# Patient Record
Sex: Male | Born: 1960 | Race: Black or African American | Hispanic: No | Marital: Married | Smoking: Never smoker
Health system: Southern US, Community
[De-identification: ages and names within clinical notes are randomized; demographics above are authoritative.]

## PROBLEM LIST (undated history)

## (undated) DIAGNOSIS — R590 Localized enlarged lymph nodes: Secondary | ICD-10-CM

## (undated) DIAGNOSIS — I10 Essential (primary) hypertension: Secondary | ICD-10-CM

## (undated) DIAGNOSIS — I517 Cardiomegaly: Secondary | ICD-10-CM

## (undated) DIAGNOSIS — Z9114 Patient's other noncompliance with medication regimen: Secondary | ICD-10-CM

## (undated) DIAGNOSIS — I5022 Chronic systolic (congestive) heart failure: Secondary | ICD-10-CM

## (undated) DIAGNOSIS — G4730 Sleep apnea, unspecified: Secondary | ICD-10-CM

## (undated) DIAGNOSIS — N183 Chronic kidney disease, stage 3 unspecified: Secondary | ICD-10-CM

## (undated) DIAGNOSIS — I428 Other cardiomyopathies: Secondary | ICD-10-CM

## (undated) DIAGNOSIS — E785 Hyperlipidemia, unspecified: Secondary | ICD-10-CM

## (undated) DIAGNOSIS — Z91148 Patient's other noncompliance with medication regimen for other reason: Secondary | ICD-10-CM

## (undated) DIAGNOSIS — I119 Hypertensive heart disease without heart failure: Secondary | ICD-10-CM

## (undated) DIAGNOSIS — I77810 Thoracic aortic ectasia: Secondary | ICD-10-CM

## (undated) HISTORY — DX: Patient's other noncompliance with medication regimen for other reason: Z91.148

## (undated) HISTORY — DX: Hyperlipidemia, unspecified: E78.5

## (undated) HISTORY — PX: VASECTOMY: SHX75

## (undated) HISTORY — DX: Chronic systolic (congestive) heart failure: I50.22

## (undated) HISTORY — DX: Chronic kidney disease, stage 3 (moderate): N18.3

## (undated) HISTORY — DX: Localized enlarged lymph nodes: R59.0

## (undated) HISTORY — PX: TUMOR EXCISION: SHX421

## (undated) HISTORY — DX: Cardiomegaly: I51.7

## (undated) HISTORY — DX: Other cardiomyopathies: I42.8

## (undated) HISTORY — DX: Patient's other noncompliance with medication regimen: Z91.14

## (undated) HISTORY — PX: FACIAL FRACTURE SURGERY: SHX1570

## (undated) HISTORY — DX: Chronic kidney disease, stage 3 unspecified: N18.30

## (undated) HISTORY — DX: Hypertensive heart disease without heart failure: I11.9

---

## 2006-05-02 ENCOUNTER — Ambulatory Visit: Payer: Self-pay | Admitting: Internal Medicine

## 2007-07-25 ENCOUNTER — Encounter: Admission: RE | Admit: 2007-07-25 | Discharge: 2007-07-25 | Payer: Self-pay | Admitting: Otolaryngology

## 2007-09-30 ENCOUNTER — Ambulatory Visit: Payer: Self-pay | Admitting: Family Medicine

## 2008-10-07 ENCOUNTER — Ambulatory Visit: Payer: Self-pay | Admitting: Family Medicine

## 2008-11-19 ENCOUNTER — Ambulatory Visit: Payer: Self-pay | Admitting: Gastroenterology

## 2009-02-02 ENCOUNTER — Ambulatory Visit: Payer: Self-pay | Admitting: Otolaryngology

## 2009-02-02 ENCOUNTER — Ambulatory Visit: Payer: Self-pay | Admitting: Cardiology

## 2009-02-08 ENCOUNTER — Ambulatory Visit: Payer: Self-pay | Admitting: Otolaryngology

## 2009-04-19 ENCOUNTER — Ambulatory Visit: Payer: Self-pay | Admitting: Internal Medicine

## 2009-11-11 ENCOUNTER — Ambulatory Visit: Payer: Self-pay | Admitting: Nephrology

## 2010-04-24 ENCOUNTER — Encounter: Payer: Self-pay | Admitting: Otolaryngology

## 2012-08-08 ENCOUNTER — Ambulatory Visit: Payer: Self-pay | Admitting: Ophthalmology

## 2014-04-17 ENCOUNTER — Encounter (HOSPITAL_COMMUNITY): Payer: Self-pay | Admitting: Emergency Medicine

## 2014-04-17 ENCOUNTER — Emergency Department (HOSPITAL_COMMUNITY): Payer: No Typology Code available for payment source

## 2014-04-17 ENCOUNTER — Inpatient Hospital Stay (HOSPITAL_COMMUNITY)
Admission: EM | Admit: 2014-04-17 | Discharge: 2014-04-21 | DRG: 286 | Disposition: A | Discharge status: Home / Self Care | Payer: No Typology Code available for payment source | Attending: Internal Medicine | Admitting: Internal Medicine

## 2014-04-17 ENCOUNTER — Inpatient Hospital Stay (HOSPITAL_COMMUNITY): Payer: No Typology Code available for payment source

## 2014-04-17 DIAGNOSIS — N179 Acute kidney failure, unspecified: Secondary | ICD-10-CM | POA: Diagnosis present

## 2014-04-17 DIAGNOSIS — R634 Abnormal weight loss: Secondary | ICD-10-CM | POA: Diagnosis present

## 2014-04-17 DIAGNOSIS — I10 Essential (primary) hypertension: Secondary | ICD-10-CM | POA: Diagnosis present

## 2014-04-17 DIAGNOSIS — G4733 Obstructive sleep apnea (adult) (pediatric): Secondary | ICD-10-CM | POA: Diagnosis present

## 2014-04-17 DIAGNOSIS — I130 Hypertensive heart and chronic kidney disease with heart failure and stage 1 through stage 4 chronic kidney disease, or unspecified chronic kidney disease: Principal | ICD-10-CM | POA: Diagnosis present

## 2014-04-17 DIAGNOSIS — Z823 Family history of stroke: Secondary | ICD-10-CM

## 2014-04-17 DIAGNOSIS — Z9119 Patient's noncompliance with other medical treatment and regimen: Secondary | ICD-10-CM | POA: Diagnosis present

## 2014-04-17 DIAGNOSIS — E785 Hyperlipidemia, unspecified: Secondary | ICD-10-CM | POA: Diagnosis present

## 2014-04-17 DIAGNOSIS — J019 Acute sinusitis, unspecified: Secondary | ICD-10-CM | POA: Diagnosis present

## 2014-04-17 DIAGNOSIS — R05 Cough: Secondary | ICD-10-CM | POA: Diagnosis present

## 2014-04-17 DIAGNOSIS — I429 Cardiomyopathy, unspecified: Secondary | ICD-10-CM | POA: Diagnosis present

## 2014-04-17 DIAGNOSIS — Z79899 Other long term (current) drug therapy: Secondary | ICD-10-CM

## 2014-04-17 DIAGNOSIS — Z800 Family history of malignant neoplasm of digestive organs: Secondary | ICD-10-CM | POA: Diagnosis not present

## 2014-04-17 DIAGNOSIS — D869 Sarcoidosis, unspecified: Secondary | ICD-10-CM | POA: Diagnosis present

## 2014-04-17 DIAGNOSIS — Z7982 Long term (current) use of aspirin: Secondary | ICD-10-CM | POA: Diagnosis not present

## 2014-04-17 DIAGNOSIS — Z7951 Long term (current) use of inhaled steroids: Secondary | ICD-10-CM

## 2014-04-17 DIAGNOSIS — R0989 Other specified symptoms and signs involving the circulatory and respiratory systems: Secondary | ICD-10-CM

## 2014-04-17 DIAGNOSIS — I5021 Acute systolic (congestive) heart failure: Secondary | ICD-10-CM

## 2014-04-17 DIAGNOSIS — R0600 Dyspnea, unspecified: Secondary | ICD-10-CM | POA: Diagnosis present

## 2014-04-17 DIAGNOSIS — R938 Abnormal findings on diagnostic imaging of other specified body structures: Secondary | ICD-10-CM

## 2014-04-17 DIAGNOSIS — J018 Other acute sinusitis: Secondary | ICD-10-CM

## 2014-04-17 DIAGNOSIS — R9389 Abnormal findings on diagnostic imaging of other specified body structures: Secondary | ICD-10-CM

## 2014-04-17 DIAGNOSIS — E876 Hypokalemia: Secondary | ICD-10-CM | POA: Diagnosis not present

## 2014-04-17 DIAGNOSIS — N183 Chronic kidney disease, stage 3 (moderate): Secondary | ICD-10-CM | POA: Diagnosis present

## 2014-04-17 DIAGNOSIS — R059 Cough, unspecified: Secondary | ICD-10-CM

## 2014-04-17 HISTORY — DX: Sleep apnea, unspecified: G47.30

## 2014-04-17 HISTORY — DX: Essential (primary) hypertension: I10

## 2014-04-17 LAB — HEPATIC FUNCTION PANEL
ALBUMIN: 3.6 g/dL (ref 3.5–5.2)
ALT: 16 U/L (ref 0–53)
AST: 21 U/L (ref 0–37)
Alkaline Phosphatase: 64 U/L (ref 39–117)
Bilirubin, Direct: 0.1 mg/dL (ref 0.0–0.3)
Indirect Bilirubin: 0.5 mg/dL (ref 0.3–0.9)
Total Bilirubin: 0.6 mg/dL (ref 0.3–1.2)
Total Protein: 6.1 g/dL (ref 6.0–8.3)

## 2014-04-17 LAB — BRAIN NATRIURETIC PEPTIDE: B Natriuretic Peptide: 1004 pg/mL — ABNORMAL HIGH (ref 0.0–100.0)

## 2014-04-17 LAB — CBC
HCT: 39.3 % (ref 39.0–52.0)
HEMOGLOBIN: 13.5 g/dL (ref 13.0–17.0)
MCH: 29.7 pg (ref 26.0–34.0)
MCHC: 34.4 g/dL (ref 30.0–36.0)
MCV: 86.4 fL (ref 78.0–100.0)
PLATELETS: 203 10*3/uL (ref 150–400)
RBC: 4.55 MIL/uL (ref 4.22–5.81)
RDW: 12.7 % (ref 11.5–15.5)
WBC: 4.9 10*3/uL (ref 4.0–10.5)

## 2014-04-17 LAB — BASIC METABOLIC PANEL
Anion gap: 11 (ref 5–15)
BUN: 16 mg/dL (ref 6–23)
CALCIUM: 9.4 mg/dL (ref 8.4–10.5)
CO2: 28 mmol/L (ref 19–32)
Chloride: 103 mEq/L (ref 96–112)
Creatinine, Ser: 1.27 mg/dL (ref 0.50–1.35)
GFR, EST AFRICAN AMERICAN: 74 mL/min — AB (ref >90)
GFR, EST NON AFRICAN AMERICAN: 64 mL/min — AB (ref >90)
GLUCOSE: 107 mg/dL — AB (ref 70–99)
Potassium: 3.6 mmol/L (ref 3.5–5.1)
SODIUM: 142 mmol/L (ref 135–145)

## 2014-04-17 LAB — TSH: TSH: 1.866 u[IU]/mL (ref 0.350–4.500)

## 2014-04-17 MED ORDER — ZOLPIDEM TARTRATE 5 MG PO TABS: 5.0000 mg | ORAL_TABLET | Freq: Every evening | ORAL | Status: DC | PRN

## 2014-04-17 MED ORDER — DIPHENHYDRAMINE HCL 25 MG PO TABS
25.0000 mg | ORAL_TABLET | Freq: Four times a day (QID) | ORAL | Status: DC | PRN
  Filled 2014-04-17: qty 1

## 2014-04-17 MED ORDER — ACETAMINOPHEN 650 MG RE SUPP: 650.0000 mg | Freq: Four times a day (QID) | RECTAL | Status: DC | PRN

## 2014-04-17 MED ORDER — FLUTICASONE PROPIONATE 50 MCG/ACT NA SUSP
1.0000 | Freq: Every day | NASAL | Status: DC
  Administered 2014-04-17 – 2014-04-21 (×5): 1 via NASAL
  Filled 2014-04-17: qty 16

## 2014-04-17 MED ORDER — LORATADINE 10 MG PO TABS
10.0000 mg | ORAL_TABLET | Freq: Every day | ORAL | Status: DC
  Administered 2014-04-17 – 2014-04-21 (×5): 10 mg via ORAL
  Filled 2014-04-17 (×5): qty 1

## 2014-04-17 MED ORDER — ENOXAPARIN SODIUM 40 MG/0.4ML ~~LOC~~ SOLN
40.0000 mg | SUBCUTANEOUS | Status: DC
  Administered 2014-04-17 – 2014-04-20 (×4): 40 mg via SUBCUTANEOUS
  Filled 2014-04-17 (×5): qty 0.4

## 2014-04-17 MED ORDER — FUROSEMIDE 10 MG/ML IJ SOLN
40.0000 mg | Freq: Every day | INTRAMUSCULAR | Status: DC
  Administered 2014-04-17: 40 mg via INTRAVENOUS
  Filled 2014-04-17 (×2): qty 4

## 2014-04-17 MED ORDER — ASPIRIN EC 81 MG PO TBEC
81.0000 mg | DELAYED_RELEASE_TABLET | Freq: Every day | ORAL | Status: DC
  Administered 2014-04-17 – 2014-04-19 (×3): 81 mg via ORAL
  Filled 2014-04-17 (×3): qty 1

## 2014-04-17 MED ORDER — POTASSIUM CHLORIDE 20 MEQ/15ML (10%) PO SOLN
40.0000 meq | Freq: Every day | ORAL | Status: DC
  Administered 2014-04-17 – 2014-04-19 (×3): 40 meq via ORAL
  Filled 2014-04-17 (×4): qty 30

## 2014-04-17 MED ORDER — IOHEXOL 300 MG/ML  SOLN
80.0000 mL | Freq: Once | INTRAMUSCULAR | Status: AC | PRN
  Administered 2014-04-17: 80 mL via INTRAVENOUS

## 2014-04-17 MED ORDER — GUAIFENESIN 100 MG/5ML PO SYRP
200.0000 mg | ORAL_SOLUTION | Freq: Three times a day (TID) | ORAL | Status: DC | PRN
  Administered 2014-04-17: 200 mg via ORAL
  Filled 2014-04-17: qty 10

## 2014-04-17 MED ORDER — SODIUM CHLORIDE 0.9 % IV BOLUS (SEPSIS): 1000.0000 mL | Freq: Once | INTRAVENOUS | Status: DC

## 2014-04-17 MED ORDER — ALBUTEROL SULFATE (2.5 MG/3ML) 0.083% IN NEBU: 2.5000 mg | INHALATION_SOLUTION | RESPIRATORY_TRACT | Status: DC | PRN

## 2014-04-17 MED ORDER — LOSARTAN POTASSIUM 50 MG PO TABS
50.0000 mg | ORAL_TABLET | Freq: Every day | ORAL | Status: DC
  Administered 2014-04-17: 50 mg via ORAL
  Filled 2014-04-17 (×2): qty 1

## 2014-04-17 MED ORDER — AMOXICILLIN-POT CLAVULANATE 875-125 MG PO TABS
1.0000 | ORAL_TABLET | Freq: Two times a day (BID) | ORAL | Status: DC
  Administered 2014-04-17 – 2014-04-21 (×8): 1 via ORAL
  Filled 2014-04-17 (×9): qty 1

## 2014-04-17 MED ORDER — CARVEDILOL 12.5 MG PO TABS
12.5000 mg | ORAL_TABLET | Freq: Two times a day (BID) | ORAL | Status: DC
  Administered 2014-04-17 – 2014-04-19 (×4): 12.5 mg via ORAL
  Filled 2014-04-17 (×6): qty 1

## 2014-04-17 MED ORDER — ACETAMINOPHEN 325 MG PO TABS
650.0000 mg | ORAL_TABLET | Freq: Four times a day (QID) | ORAL | Status: DC | PRN
  Filled 2014-04-17: qty 2

## 2014-04-17 NOTE — ED Notes (Signed)
[facility] further eval of cough and hypertension. Pt seen there today and was coughing a lot during exam. Pt BP was 180/138 in office.

## 2014-04-17 NOTE — ED Notes (Signed)
Dr. Rhunette CroftNanavati back at the bedside. Made pt aware he would be admitted.

## 2014-04-17 NOTE — ED Notes (Signed)
Dr. Nanavati at the bedside.  

## 2014-04-17 NOTE — H&P (Signed)
[facility] History and Physical  Sergei Delo ZOX:[id number] DOB: 03/16/1961 DOA: 04/17/2014  Referring physician: EDP PCP: [name], MD   Chief Complaint: cough, dyspnea  HPI: Jake Levine is a 54 y.o. male  With h/o HTN, untreated for years, "enlarged heart", obstructive sleep apnea, previously on CPAP but reportedly improved with 40 pound weight loss, presents to the emergency room with several week history of nonproductive cough, dyspnea on exertion, orthopnea, postnasal drip. He reports being off antihypertensives for many years "because they weren't controlling blood pressure and causing too many side effects ". He denies fevers chills sinus pressure headache. He has been sleeping upright due to worsening cough and dyspnea at night. He denies edema. Denies previous history of heart failure. Has been taking Benadryl Claritin decongestants Flonase and even used his stepson's albuterol inhaler. He denies history of asthma or COPD or previous smoking but reports that the albuterol helped somewhat. He reports having had PFTs in the past and that he was told he "might have asthma". In the emergency room, BNP above 1000.  Blood pressure 172/115.  Chest x-ray showed enlarged cardiac silhouette and pulmonary vascular congestion as well as right hilar fullness.  Therefore CAT scan was ordered and shows hilar and subcarinal lymphadenopathy with subcentimeter nodules, possibly consistent with sarcoidosis. Patient has no history of same.  EKG not done in ER.  Review of Systems:  As per history of present illness. Complete systems reviewed otherwise negative.  Past Medical History  Diagnosis Date  . Hypertension   . Enlarged heart   . Renal disorder   . Renal insufficiency    Past Surgical History  Procedure Laterality Date  . Neck surgery     Social History:  reports that he has never smoked. He does not have any smokeless tobacco history on file. He reports that  he drinks about 4.2 oz of alcohol per week. He reports that he does not use illicit drugs. works in maintenance  Allergies  Allergen Reactions  . Other     Blood pressure medications    Family History  Problem Relation Age of Onset  . Colon cancer Father   . Lupus Sister   . Stroke Mother      Prior to Admission medications   Medication Sig Start Date End Date Taking? Authorizing Provider  albuterol (PROVENTIL HFA;VENTOLIN HFA) 108 (90 BASE) MCG/ACT inhaler Inhale 1-2 puffs into the lungs every 6 (six) hours as needed for wheezing or shortness of breath.   Yes Historical Provider, MD  aspirin EC 81 MG tablet Take 81 mg by mouth daily.   Yes Historical Provider, MD  diphenhydrAMINE (BENADRYL) 25 MG tablet Take 25 mg by mouth every 6 (six) hours as needed for allergies.   Yes Historical Provider, MD  fluticasone (FLONASE) 50 MCG/ACT nasal spray Place 1 spray into both nostrils daily.   Yes Historical Provider, MD  guaifenesin (ROBITUSSIN) 100 MG/5ML syrup Take 200 mg by mouth 3 (three) times daily as needed for cough.   Yes Historical Provider, MD  loratadine (CLARITIN) 10 MG tablet Take 10 mg by mouth daily.   Yes Historical Provider, MD  Phenylephrine-Pheniramine-DM [facility] COLD & COUGH PO) Take 1 Dose by mouth once.   Yes Historical Provider, MD   Physical Exam: Filed Vitals:   04/17/14 1455 04/17/14 1530 04/17/14 1628 04/17/14 1700  BP: 172/115 169/110 157/112 159/84  Pulse: 74 83 89 76  Temp:      TempSrc:      Resp: 18  21 29 28   Height:      Weight:      SpO2: 100% 98% 95% 97%    Wt Readings from Last 3 Encounters:  04/17/14 76.204 kg (168 lb)  BP 164/107 mmHg  Pulse 75  Temp(Src) 98.4 F (36.9 C) (Oral)  Resp 20  Ht 5\' 6"  (1.676 m)  Wt 76.204 kg (168 lb)  BMI 27.13 kg/m2  SpO2 97%  General Appearance:    Alert, cooperative, no distress, appears stated age  Head:    Normocephalic, without obvious abnormality, atraumatic  Eyes:    PERRL, conjunctiva/corneas  clear, EOM's intact      Ears:    Normal TM's and external ear canals, both ears  Nose:   no sinus tenderness. Boggy turbinates. Nares without drainage.   Throat:   Lips, mucosa, and tongue normal; teeth and gums normal. oropharynx without erythema or exudate   Neck:   Supple, symmetrical, trachea midline, no adenopathy;       thyroid:  No enlargement/tenderness/nodules; no carotid   bruit or JVD  Back:     Symmetric, no curvature, ROM normal, no CVA tenderness  Lungs:     Clear to auscultation bilaterally, respirations unlabored  Chest wall:    No tenderness or deformity  Heart:    Regular rate and rhythm, S1 and S2 normal, no murmur, rub   or gallop  Abdomen:     Soft, non-tender, bowel sounds active all four quadrants,    no masses, no organomegaly  Genitalia:    deferred   Rectal:    deferred   Extremities:   Extremities normal, atraumatic, no cyanosis or edema  Pulses:   2+ and symmetric all extremities  Skin:   Skin color, texture, turgor normal, no rashes or lesions  Lymph nodes:   Cervical, supraclavicular, and axillary nodes normal  Neurologic:   CNII-XII intact. Normal strength, sensation and reflexes      throughout    Psychiatric: Normal affect. Calm and cooperative except somewhat defensive        Labs on Admission:  Basic Metabolic Panel:  Recent Labs Lab 04/17/14 1019  NA 142  K 3.6  CL 103  CO2 28  GLUCOSE 107*  BUN 16  CREATININE 1.27  CALCIUM 9.4   Liver Function Tests: No results for input(s): AST, ALT, ALKPHOS, BILITOT, PROT, ALBUMIN in the last 168 hours. No results for input(s): LIPASE, AMYLASE in the last 168 hours. No results for input(s): AMMONIA in the last 168 hours. CBC:  Recent Labs Lab 04/17/14 1019  WBC 4.9  HGB 13.5  HCT 39.3  MCV 86.4  PLT 203   Cardiac Enzymes: No results for input(s): CKTOTAL, CKMB, CKMBINDEX, TROPONINI in the last 168 hours.  BNP (last 3 results) No results for input(s): PROBNP in the last 8760  hours. CBG: No results for input(s): GLUCAP in the last 168 hours.  Radiological Exams on Admission: Dg Chest 2 View  04/17/2014   CLINICAL DATA:  Cough for 2 months, hypertension  EXAM: CHEST  2 VIEW  COMPARISON:  None  FINDINGS: Enlargement of cardiac silhouette.  Pulmonary vascular congestion.  Enlargement of RIGHT hilum question adenopathy versus mass.  Tortuosity of thoracic aorta.  Slight accentuation of interstitial markings diffusely.  No segmental consolidation, pleural effusion or pneumothorax.  No acute osseous findings.  IMPRESSION: Enlargement of cardiac silhouette with pulmonary vascular congestion.  Enlarged RIGHT hilum cannot exclude mass or hilar adenopathy; CT chest with contrast recommended to evaluate.  Electronically Signed   By: Ulyses Southward M.D.   On: 04/17/2014 13:13   Ct Chest W Contrast  04/17/2014   CLINICAL DATA:  Coughing, hypertension.  EXAM: CT CHEST WITH CONTRAST  TECHNIQUE: Multidetector CT imaging of the chest was performed during intravenous contrast administration.  CONTRAST:  80mL OMNIPAQUE IOHEXOL 300 MG/ML  SOLN  COMPARISON:  Radiograph 04/17/2014  FINDINGS: Mediastinum/Nodes: No axillary supraclavicular lymphadenopathy. There is mild hilar lymphadenopathy on the right with a 2.4 x 1.6 cm lymph node. There is no right hilar mass. No central pulmonary embolism. There is mild subcarinal adenopathy measuring 18 mm  Lungs/Pleura: Several small nodules along the right oblique fissure (image 45, series 3). Several subpleural nodules peripherally in the right upper lobe and right lower lobe (image 46). Left upper lobe parenchymal nodule measures 4 mm image 16, series 3. Subpleural nodule in the left lower lobe image 40.  Upper abdomen: Limited view of the liver, kidneys, pancreas are unremarkable.  Musculoskeletal: No aggressive osseous lesion.  IMPRESSION: Mild right hilar and subcarinal lymphadenopathy coupled with the scattered of pulmonary nodules many of which are in a  are subpleural location in suggestive of sarcoidosis. If the workup for sarcoidosis is negative then recommend followup CT of the chest with contrast in 3 months to demonstrate stability of the adenopathy and the multiple small nodules.   Electronically Signed   By: Genevive Bi M.D.   On: 04/17/2014 15:36    Assessment/Plan Principal Problem:   Dyspnea:  Given orthopnea, elevated BNP, uncontrolled hypertension, enlarged cardiac silhouette on imaging, may be new heart failure. Patient has not received Lasix will give a dose here. Needs blood pressure control. Will start with losartan and low-dose Coreg. Will likely need titration of antihypertensives. Check echocardiogram, TSH. Check EKG. Admit to telemetry.  Given abnormal CAT scan, also consider sarcoidosis. See below. Active Problems:   Accelerated hypertension:  See above. Patient agrees to trial of antihypertensives, but worried about side effects.   Acute sinusitis:  Patient has had postnasal drip and cough for over 2 weeks despite decongestants, Flonase, antihistamines.  Will give 5 day course of Augmentin.   Abnormal CT scan, chest:  Possible sarcoidosis. Will check Ace level, HIV, LFTs. Will likely need outpatient PFTs and follow-up with pulmonary medicine versus repeat CAT scan in 3 months.   Time spent: 60 minutes  An Lannan L [facility]

## 2014-04-17 NOTE — Progress Notes (Signed)
Pt transferred from ED,. A7) x4.  Oriented room. Call bell at reach.  Instructed to call for assistance as needed.  Denies pain/comfort.

## 2014-04-18 ENCOUNTER — Encounter (HOSPITAL_COMMUNITY): Payer: Self-pay | Admitting: General Practice

## 2014-04-18 DIAGNOSIS — E876 Hypokalemia: Secondary | ICD-10-CM

## 2014-04-18 DIAGNOSIS — N179 Acute kidney failure, unspecified: Secondary | ICD-10-CM

## 2014-04-18 DIAGNOSIS — I5031 Acute diastolic (congestive) heart failure: Secondary | ICD-10-CM

## 2014-04-18 LAB — BASIC METABOLIC PANEL
Anion gap: 8 (ref 5–15)
BUN: 20 mg/dL (ref 6–23)
CO2: 29 mmol/L (ref 19–32)
CREATININE: 1.5 mg/dL — AB (ref 0.50–1.35)
Calcium: 9.1 mg/dL (ref 8.4–10.5)
Chloride: 107 mEq/L (ref 96–112)
GFR, EST AFRICAN AMERICAN: 61 mL/min — AB (ref >90)
GFR, EST NON AFRICAN AMERICAN: 52 mL/min — AB (ref >90)
Glucose, Bld: 106 mg/dL — ABNORMAL HIGH (ref 70–99)
POTASSIUM: 3.4 mmol/L — AB (ref 3.5–5.1)
SODIUM: 144 mmol/L (ref 135–145)

## 2014-04-18 MED ORDER — FUROSEMIDE 40 MG PO TABS
40.0000 mg | ORAL_TABLET | Freq: Every day | ORAL | Status: DC
  Administered 2014-04-18 – 2014-04-21 (×4): 40 mg via ORAL
  Filled 2014-04-18 (×5): qty 1

## 2014-04-18 MED ORDER — HYDRALAZINE HCL 20 MG/ML IJ SOLN
10.0000 mg | Freq: Once | INTRAMUSCULAR | Status: AC
  Administered 2014-04-18: 10 mg via INTRAVENOUS
  Filled 2014-04-18: qty 1

## 2014-04-18 NOTE — Progress Notes (Signed)
  Echocardiogram 2D Echocardiogram has been performed.  Jake Levine, Jake Levine 04/18/2014, 5:10 PM

## 2014-04-18 NOTE — Progress Notes (Signed)
PROGRESS NOTE    Zadin Lange JXB:[id number] DOB: February 17, 1961 DOA: 04/17/2014 PCP: [name], MD  HPI/Brief narrative 54 y.o. male with h/o HTN, untreated for years, "enlarged heart", obstructive sleep apnea, previously on CPAP but reportedly improved with 40 pound weight loss, presents to the emergency room with several week history of nonproductive cough, dyspnea on exertion, orthopnea, postnasal drip. Denies previous history of heart failure. He denies history of asthma or COPD or previous smoking but reports that the albuterol helped somewhat. He reports having had PFTs in the past and that he was told he "might have asthma". In the emergency room, BNP above 1000. Blood pressure 172/115. Chest x-ray showed enlarged cardiac silhouette and pulmonary vascular congestion as well as right hilar fullness. Therefore CAT scan was ordered and shows hilar and subcarinal lymphadenopathy with subcentimeter nodules, possibly consistent with sarcoidosis.    Assessment/Plan:  1. Dyspnea/acute possibly diastolic CHF: Related to uncontrolled hypertension. Significantly improved after a dose of IV Lasix. Creatinine has slightly increased. Thereby will transition to oral Lasix. Follow 2-D echo. TSH normal. Orthopnea seems to have resolved. No EKG seen in Epic-will order. 2. Accelerated hypertension: Secondary to noncompliance. Patient was started on carvedilol and ARB. Creatinine however has bumped up and hence will hold ARB which may be initiated at a later time. Better control blood pressure. Follow. 3. Acute on chronic kidney disease: Creatinine has jumped up from 1.27 > 1.5 secondary to diuretics and ARB. Change Lasix to by mouth. Hold ARB. Follow BMP in a.m. 4. Hypokalemia: Replace and follow BMP 5. Acute sinusitis: Continue Augmentin. 6. Abnormal CT chest/possible sarcoidosis: Outpatient follow-up and evaluation as deemed necessary   Code Status: Full Family Communication: None at  bedside Disposition Plan: Home in medically stable   Consultants:  None  Procedures:  None  Antibiotics:  Augmentin   Subjective: Feels much better. States that for the last 1 week, he was basically sleeping on the chair at night due to orthopnea. Last night he was able to sleep in bed the whole night. Denies dyspnea or chest pain.  Objective: Filed Vitals:   04/18/14 0114 04/18/14 0400 04/18/14 0545 04/18/14 1048  BP: 159/111 122/71 128/64 129/84  Pulse: 69 63 68 68  Temp:  97.4 F (36.3 C) 97.4 F (36.3 C)   TempSrc:  Oral Oral   Resp:  18 18   Height:      Weight:      SpO2:  98% 100%     Intake/Output Summary (Last 24 hours) at 04/18/14 1243 Last data filed at 04/18/14 0700  Gross per 24 hour  Intake    780 ml  Output   1801 ml  Net  -1021 ml   Filed Weights   04/17/14 1015 04/17/14 1928  Weight: 76.204 kg (168 lb) 71.895 kg (158 lb 8 oz)     Exam:  General exam: Pleasant young male lying comfortably supine in bed. Respiratory system: Occasional fine basal crackles but otherwise clear to auscultation. No increased work of breathing. Cardiovascular system: S1 & S2 heard, RRR. No JVD, murmurs, gallops, clicks or pedal edema. Telemetry: Sinus rhythm with BBB morphology Gastrointestinal system: Abdomen is nondistended, soft and nontender. Normal bowel sounds heard. Central nervous system: Alert and oriented. No focal neurological deficits. Extremities: Symmetric 5 x 5 power.   Data Reviewed: Basic Metabolic Panel:  Recent Labs Lab 04/17/14 1019 04/18/14 0451  NA 142 144  K 3.6 3.4*  CL 103 107  CO2 28 29  GLUCOSE  107* 106*  BUN 16 20  CREATININE 1.27 1.50*  CALCIUM 9.4 9.1   Liver Function Tests:  Recent Labs Lab 04/17/14 2055  AST 21  ALT 16  ALKPHOS 64  BILITOT 0.6  PROT 6.1  ALBUMIN 3.6   No results for input(s): LIPASE, AMYLASE in the last 168 hours. No results for input(s): AMMONIA in the last 168 hours. CBC:  Recent  Labs Lab 04/17/14 1019  WBC 4.9  HGB 13.5  HCT 39.3  MCV 86.4  PLT 203   Cardiac Enzymes: No results for input(s): CKTOTAL, CKMB, CKMBINDEX, TROPONINI in the last 168 hours. BNP (last 3 results) No results for input(s): PROBNP in the last 8760 hours. CBG: No results for input(s): GLUCAP in the last 168 hours.  No results found for this or any previous visit (from the past 240 hour(s)).        Studies: Dg Chest 2 View  04/17/2014   CLINICAL DATA:  Cough for 2 months, hypertension  EXAM: CHEST  2 VIEW  COMPARISON:  None  FINDINGS: Enlargement of cardiac silhouette.  Pulmonary vascular congestion.  Enlargement of RIGHT hilum question adenopathy versus mass.  Tortuosity of thoracic aorta.  Slight accentuation of interstitial markings diffusely.  No segmental consolidation, pleural effusion or pneumothorax.  No acute osseous findings.  IMPRESSION: Enlargement of cardiac silhouette with pulmonary vascular congestion.  Enlarged RIGHT hilum cannot exclude mass or hilar adenopathy; CT chest with contrast recommended to evaluate.   Electronically Signed   By: Ulyses SouthwardMark  Boles M.D.   On: 04/17/2014 13:13   Ct Chest W Contrast  04/17/2014   CLINICAL DATA:  Coughing, hypertension.  EXAM: CT CHEST WITH CONTRAST  TECHNIQUE: Multidetector CT imaging of the chest was performed during intravenous contrast administration.  CONTRAST:  80mL OMNIPAQUE IOHEXOL 300 MG/ML  SOLN  COMPARISON:  Radiograph 04/17/2014  FINDINGS: Mediastinum/Nodes: No axillary supraclavicular lymphadenopathy. There is mild hilar lymphadenopathy on the right with a 2.4 x 1.6 cm lymph node. There is no right hilar mass. No central pulmonary embolism. There is mild subcarinal adenopathy measuring 18 mm  Lungs/Pleura: Several small nodules along the right oblique fissure (image 45, series 3). Several subpleural nodules peripherally in the right upper lobe and right lower lobe (image 46). Left upper lobe parenchymal nodule measures 4 mm image  16, series 3. Subpleural nodule in the left lower lobe image 40.  Upper abdomen: Limited view of the liver, kidneys, pancreas are unremarkable.  Musculoskeletal: No aggressive osseous lesion.  IMPRESSION: Mild right hilar and subcarinal lymphadenopathy coupled with the scattered of pulmonary nodules many of which are in a are subpleural location in suggestive of sarcoidosis. If the workup for sarcoidosis is negative then recommend followup CT of the chest with contrast in 3 months to demonstrate stability of the adenopathy and the multiple small nodules.   Electronically Signed   By: Genevive BiStewart  Edmunds M.D.   On: 04/17/2014 15:36        Scheduled Meds: . amoxicillin-clavulanate  1 tablet Oral Q12H  . aspirin EC  81 mg Oral Daily  . carvedilol  12.5 mg Oral BID WC  . enoxaparin (LOVENOX) injection  40 mg Subcutaneous Q24H  . fluticasone  1 spray Each Nare Daily  . furosemide  40 mg Oral Daily  . loratadine  10 mg Oral Daily  . potassium chloride  40 mEq Oral Daily   Continuous Infusions:   Principal Problem:   Dyspnea Active Problems:   Accelerated hypertension   Acute sinusitis  Abnormal CT scan, chest    Time spent: 40 minutes    Terrill Alperin, MD, FACP, FHM. [facility] Pager [phone]  If 7PM-7AM, please contact night-coverage [email or web address] Password [email or web address] 04/18/2014, 12:43 PM    LOS: 1 day

## 2014-04-19 DIAGNOSIS — I429 Cardiomyopathy, unspecified: Secondary | ICD-10-CM

## 2014-04-19 DIAGNOSIS — I5021 Acute systolic (congestive) heart failure: Secondary | ICD-10-CM

## 2014-04-19 LAB — BASIC METABOLIC PANEL
Anion gap: 5 (ref 5–15)
BUN: 26 mg/dL — AB (ref 6–23)
CALCIUM: 9.1 mg/dL (ref 8.4–10.5)
CHLORIDE: 112 meq/L (ref 96–112)
CO2: 25 mmol/L (ref 19–32)
CREATININE: 1.4 mg/dL — AB (ref 0.50–1.35)
GFR calc Af Amer: 66 mL/min — ABNORMAL LOW (ref >90)
GFR, EST NON AFRICAN AMERICAN: 57 mL/min — AB (ref >90)
GLUCOSE: 100 mg/dL — AB (ref 70–99)
Potassium: 3.4 mmol/L — ABNORMAL LOW (ref 3.5–5.1)
Sodium: 142 mmol/L (ref 135–145)

## 2014-04-19 LAB — MAGNESIUM: Magnesium: 2 mg/dL (ref 1.5–2.5)

## 2014-04-19 LAB — HIV ANTIBODY (ROUTINE TESTING W REFLEX): HIV-1/HIV-2 Ab: NONREACTIVE

## 2014-04-19 MED ORDER — SODIUM CHLORIDE 0.9 % IV SOLN
INTRAVENOUS | Status: DC
Start: 2014-04-20 — End: 2014-04-21
  Administered 2014-04-20: 06:00:00 via INTRAVENOUS

## 2014-04-19 MED ORDER — ASPIRIN 81 MG PO CHEW
81.0000 mg | CHEWABLE_TABLET | ORAL | Status: AC
  Administered 2014-04-20: 81 mg via ORAL
  Filled 2014-04-19: qty 1

## 2014-04-19 MED ORDER — CARVEDILOL 25 MG PO TABS
25.0000 mg | ORAL_TABLET | Freq: Two times a day (BID) | ORAL | Status: DC
  Administered 2014-04-19 – 2014-04-21 (×4): 25 mg via ORAL
  Filled 2014-04-19 (×6): qty 1

## 2014-04-19 MED ORDER — SODIUM CHLORIDE 0.9 % IJ SOLN
3.0000 mL | Freq: Two times a day (BID) | INTRAMUSCULAR | Status: DC
  Administered 2014-04-20 – 2014-04-21 (×2): 3 mL via INTRAVENOUS

## 2014-04-19 MED ORDER — ASPIRIN EC 81 MG PO TBEC
81.0000 mg | DELAYED_RELEASE_TABLET | Freq: Every day | ORAL | Status: DC
Start: 2014-04-21 — End: 2014-04-21
  Administered 2014-04-21: 81 mg via ORAL
  Filled 2014-04-19: qty 1

## 2014-04-19 MED ORDER — SODIUM CHLORIDE 0.9 % IV SOLN
250.0000 mL | INTRAVENOUS | Status: DC | PRN
Start: 2014-04-19 — End: 2014-04-21

## 2014-04-19 MED ORDER — SODIUM CHLORIDE 0.9 % IJ SOLN: 3.0000 mL | INTRAMUSCULAR | Status: DC | PRN

## 2014-04-19 MED ORDER — POTASSIUM CHLORIDE CRYS ER 20 MEQ PO TBCR
40.0000 meq | EXTENDED_RELEASE_TABLET | Freq: Once | ORAL | Status: AC
  Administered 2014-04-19: 40 meq via ORAL
  Filled 2014-04-19: qty 2

## 2014-04-19 NOTE — Progress Notes (Signed)
PROGRESS NOTE    Jake Levine ZOX:[id number] DOB: December 15, 1960 DOA: 04/17/2014 PCP: [name], MD  HPI/Brief narrative 54 y.o. male with h/o HTN, untreated for years, "enlarged heart", obstructive sleep apnea, previously on CPAP but reportedly improved with 40 pound weight loss, presents to the emergency room with several week history of nonproductive cough, dyspnea on exertion, orthopnea, postnasal drip. Denies previous history of heart failure. He denies history of asthma or COPD or previous smoking but reports that the albuterol helped somewhat. He reports having had PFTs in the past and that he was told he "might have asthma". In the emergency room, BNP above 1000. Blood pressure 172/115. Chest x-ray showed enlarged cardiac silhouette and pulmonary vascular congestion as well as right hilar fullness. Therefore CAT scan was ordered and shows hilar and subcarinal lymphadenopathy with subcentimeter nodules, possibly consistent with sarcoidosis.    Assessment/Plan:  1. 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 TSH normal. Orthopnea resolved. Treated briefly with IV Lasix but creatinine rapidly increased and hence switched to oral Lasix. Creatinine improving. Cardiology consulted-increasing carvedilol. Plan for cardiac cath. Will need repeat echo in 3 months to reassess for improvement in EF. 2. Accelerated hypertension: Secondary to noncompliance. Patient was started on carvedilol and ARB. Creatinine however bumped up and hence held ARB which may be initiated at a later time. Increasing carvedilol. Monitor. Will need to reinitiate ARB (possibly post-cath) due to cardiomyopathy. 3. Acute on chronic kidney disease: Creatinine has jumped up from 1.27 > 1.5 secondary to diuretics and ARB. Changed Lasix to by mouth. Hold ARB. Creatinine improving. Follow BMP in  am. 4. Hypokalemia: Replace and follow BMP 5. Acute sinusitis: Continue Augmentin. 6. Abnormal CT chest/possible sarcoidosis: Outpatient follow-up and evaluation as deemed necessary   Code Status: Full Family Communication: None at bedside Disposition Plan: Home when medically stable   Consultants:  Cardiology  Procedures:  None  Antibiotics:  Augmentin   Subjective: No chest pain, dyspnea or orthopnea. Denies any other complaints.  Objective: Filed Vitals:   04/18/14 1833 04/18/14 2007 04/19/14 0150 04/19/14 0639  BP: 154/96 163/92 150/85 143/98  Pulse: 77 72 68 61  Temp:  98.2 F (36.8 C) 98 F (36.7 C) 97.6 F (36.4 C)  TempSrc:  Oral Oral Oral  Resp:  Height:      Weight:    71.578 kg (157 lb 12.8 oz)  SpO2:  98% 97% 98%    Intake/Output Summary (Last 24 hours) at 04/19/14 1333 Last data filed at 04/19/14 1000  Gross per 24 hour  Intake   1218 ml  Output   1700 ml  Net   -482 ml   Filed Weights   04/17/14 1015 04/17/14 1928 04/19/14 0639  Weight: 76.204 kg (168 lb) 71.895 kg (158 lb 8 oz) 71.578 kg (157 lb 12.8 oz)     Exam:  General exam: Pleasant young male lying comfortably supine in bed. Respiratory system: clear to auscultation. No increased work of breathing. Cardiovascular system: S1 & S2 heard, RRR. No JVD, murmurs, gallops, clicks or pedal edema. Telemetry: Sinus rhythm with occasional PVCs Gastrointestinal system: Abdomen is nondistended, soft and nontender. Normal bowel sounds heard. Central nervous system: Alert and oriented. No focal neurological deficits. Extremities: Symmetric 5 x 5 power.   Data Reviewed: Basic Metabolic Panel:  Recent Labs Lab 04/17/14 1019 04/18/14 0451 04/19/14 0520  NA 142 144 142  K 3.6  3.4* 3.4*  CL 103 107 112  CO2 28 29 25   GLUCOSE 107* 106* 100*  BUN 16 20 26*  CREATININE 1.27 1.50* 1.40*  CALCIUM 9.4 9.1 9.1   Liver Function Tests:  Recent Labs Lab 04/17/14 2055  AST 21    ALT 16  ALKPHOS 64  BILITOT 0.6  PROT 6.1  ALBUMIN 3.6   No results for input(s): LIPASE, AMYLASE in the last 168 hours. No results for input(s): AMMONIA in the last 168 hours. CBC:  Recent Labs Lab 04/17/14 1019  WBC 4.9  HGB 13.5  HCT 39.3  MCV 86.4  PLT 203   Cardiac Enzymes: No results for input(s): CKTOTAL, CKMB, CKMBINDEX, TROPONINI in the last 168 hours. BNP (last 3 results) No results for input(s): PROBNP in the last 8760 hours. CBG: No results for input(s): GLUCAP in the last 168 hours.  No results found for this or any previous visit (from the past 240 hour(s)).        Studies: Ct Chest W Contrast  04/17/2014   CLINICAL DATA:  Coughing, hypertension.  EXAM: CT CHEST WITH CONTRAST  TECHNIQUE: Multidetector CT imaging of the chest was performed during intravenous contrast administration.  CONTRAST:  80mL OMNIPAQUE IOHEXOL 300 MG/ML  SOLN  COMPARISON:  Radiograph 04/17/2014  FINDINGS: Mediastinum/Nodes: No axillary supraclavicular lymphadenopathy. There is mild hilar lymphadenopathy on the right with a 2.4 x 1.6 cm lymph node. There is no right hilar mass. No central pulmonary embolism. There is mild subcarinal adenopathy measuring 18 mm  Lungs/Pleura: Several small nodules along the right oblique fissure (image 45, series 3). Several subpleural nodules peripherally in the right upper lobe and right lower lobe (image 46). Left upper lobe parenchymal nodule measures 4 mm image 16, series 3. Subpleural nodule in the left lower lobe image 40.  Upper abdomen: Limited view of the liver, kidneys, pancreas are unremarkable.  Musculoskeletal: No aggressive osseous lesion.  IMPRESSION: Mild right hilar and subcarinal lymphadenopathy coupled with the scattered of pulmonary nodules many of which are in a are subpleural location in suggestive of sarcoidosis. If the workup for sarcoidosis is negative then recommend followup CT of the chest with contrast in 3 months to demonstrate  stability of the adenopathy and the multiple small nodules.   Electronically Signed   By: Genevive BiStewart  Edmunds M.D.   On: 04/17/2014 15:36        Scheduled Meds: . amoxicillin-clavulanate  1 tablet Oral Q12H  . aspirin EC  81 mg Oral Daily  . carvedilol  25 mg Oral BID WC  . enoxaparin (LOVENOX) injection  40 mg Subcutaneous Q24H  . fluticasone  1 spray Each Nare Daily  . furosemide  40 mg Oral Daily  . loratadine  10 mg Oral Daily  . potassium chloride  40 mEq Oral Daily   Continuous Infusions:   Principal Problem:   Dyspnea Active Problems:   Accelerated hypertension   Acute sinusitis   Abnormal CT scan, chest    Time spent: 30 minutes    Promiss Labarbera, MD, FACP, FHM. [facility] Pager [phone]  If 7PM-7AM, please contact night-coverage [email or web address] Password [email or web address] 04/19/2014, 1:33 PM    LOS: 2 days

## 2014-04-19 NOTE — ED Provider Notes (Signed)
CSN: [id number]     Arrival date & time 04/17/14  4098 History   First MD Initiated Contact with Patient 04/17/14 1041     Chief Complaint  Patient presents with  . Cough  . Hypertension     (Consider location/radiation/quality/duration/timing/severity/associated sxs/prior Treatment) HPI Comments: Pt comes in with cc of "r/o CHF." Pt has hx of HTN, uncontrolled. No cardiac hx or lung dz. Pt has been having a cough for a long time. Cough is non productive. Cough is worse at night, or when he lays flat. He has no orthopnea, PND, chest pain. Dyspnea on HEAVY exertion only. No wheezing. Pt has no hx of PE, DVT and denies any exogenous estrogen use, long distance travels or surgery in the past 6 weeks, active cancer, recent immobilization.    Patient is a 54 y.o. male presenting with cough and hypertension. The history is provided by the patient.  Cough Associated symptoms: chest pain and shortness of breath   Hypertension Associated symptoms include chest pain and shortness of breath. Pertinent negatives include no abdominal pain.    Past Medical History  Diagnosis Date  . Hypertension   . Enlarged heart   . Renal disorder   . Renal insufficiency   . Sleep apnea     "lost 75#; no need for mask anymore" (04/18/14)   Past Surgical History  Procedure Laterality Date  . Tumor excision Left <2010    "fatty growth"  . Facial fracture surgery Right ~ 1990  . Vasectomy     Family History  Problem Relation Age of Onset  . Colon cancer Father   . Lupus Sister   . Stroke Mother    History  Substance Use Topics  . Smoking status: Never Smoker   . Smokeless tobacco: Never Used  . Alcohol Use: 4.2 oz/week    7 Glasses of wine per week    Review of Systems  Constitutional: Positive for activity change. Negative for appetite change.  Respiratory: Positive for cough and shortness of breath.   Cardiovascular: Positive for chest pain.  Gastrointestinal: Negative for abdominal pain.   Genitourinary: Negative for dysuria.  All other systems reviewed and are negative.     Allergies  Other  Home Medications   Prior to Admission medications   Medication Sig Start Date End Date Taking? Authorizing Provider  albuterol (PROVENTIL HFA;VENTOLIN HFA) 108 (90 BASE) MCG/ACT inhaler Inhale 1-2 puffs into the lungs every 6 (six) hours as needed for wheezing or shortness of breath.   Yes Historical Provider, MD  aspirin EC 81 MG tablet Take 81 mg by mouth daily.   Yes Historical Provider, MD  diphenhydrAMINE (BENADRYL) 25 MG tablet Take 25 mg by mouth every 6 (six) hours as needed for allergies.   Yes Historical Provider, MD  fluticasone (FLONASE) 50 MCG/ACT nasal spray Place 1 spray into both nostrils daily.   Yes Historical Provider, MD  guaifenesin (ROBITUSSIN) 100 MG/5ML syrup Take 200 mg by mouth 3 (three) times daily as needed for cough.   Yes Historical Provider, MD  loratadine (CLARITIN) 10 MG tablet Take 10 mg by mouth daily.   Yes Historical Provider, MD  Phenylephrine-Pheniramine-DM [facility] COLD & COUGH PO) Take 1 Dose by mouth once.   Yes Historical Provider, MD   BP 143/98 mmHg  Pulse 61  Temp(Src) 97.6 F (36.4 C) (Oral)  Resp 17  Ht  (1.676 m)  Wt 157 lb 12.8 oz (71.578 kg)  BMI 25.48 kg/m2  SpO2 98% Physical  Exam  Constitutional: He is oriented to person, place, and time. He appears well-developed.  HENT:  Head: Normocephalic and atraumatic.  Eyes: Conjunctivae and EOM are normal. Pupils are equal, round, and reactive to light.  Neck: Normal range of motion. Neck supple.  Cardiovascular: Normal rate and regular rhythm.   Pulmonary/Chest: Effort normal and breath sounds normal. No respiratory distress. He has no wheezes.  Abdominal: Soft. Bowel sounds are normal. He exhibits no distension. There is no tenderness. There is no rebound and no guarding.  Musculoskeletal: He exhibits no edema.  Neurological: He is alert and oriented to person, place,  and time.  Skin: Skin is warm.  Nursing note and vitals reviewed.   ED Course  Procedures (including critical care time) Labs Review Labs Reviewed  BASIC METABOLIC PANEL - Abnormal; Notable for the following:    Glucose, Bld 107 (*)    GFR calc non Af Amer 64 (*)    GFR calc Af Amer 74 (*)    All other components within normal limits  BRAIN NATRIURETIC PEPTIDE - Abnormal; Notable for the following:    B Natriuretic Peptide 1004.0 (*)    All other components within normal limits  BASIC METABOLIC PANEL - Abnormal; Notable for the following:    Potassium 3.4 (*)    Glucose, Bld 106 (*)    Creatinine, Ser 1.50 (*)    GFR calc non Af Amer 52 (*)    GFR calc Af Amer 61 (*)    All other components within normal limits  BASIC METABOLIC PANEL - Abnormal; Notable for the following:    Potassium 3.4 (*)    Glucose, Bld 100 (*)    BUN 26 (*)    Creatinine, Ser 1.40 (*)    GFR calc non Af Amer 57 (*)    GFR calc Af Amer 66 (*)    All other components within normal limits  CBC  TSH  HEPATIC FUNCTION PANEL  ANGIOTENSIN CONVERTING ENZYME  HIV ANTIBODY (ROUTINE TESTING)    Imaging Review Dg Chest 2 View  04/17/2014   CLINICAL DATA:  Cough for 2 months, hypertension  EXAM: CHEST  2 VIEW  COMPARISON:  None  FINDINGS: Enlargement of cardiac silhouette.  Pulmonary vascular congestion.  Enlargement of RIGHT hilum question adenopathy versus mass.  Tortuosity of thoracic aorta.  Slight accentuation of interstitial markings diffusely.  No segmental consolidation, pleural effusion or pneumothorax.  No acute osseous findings.  IMPRESSION: Enlargement of cardiac silhouette with pulmonary vascular congestion.  Enlarged RIGHT hilum cannot exclude mass or hilar adenopathy; CT chest with contrast recommended to evaluate.   Electronically Signed   By: Ulyses Southward M.D.   On: 04/17/2014 13:13   Ct Chest W Contrast  04/17/2014   CLINICAL DATA:  Coughing, hypertension.  EXAM: CT CHEST WITH CONTRAST   TECHNIQUE: Multidetector CT imaging of the chest was performed during intravenous contrast administration.  CONTRAST:  80mL OMNIPAQUE IOHEXOL 300 MG/ML  SOLN  COMPARISON:  Radiograph 04/17/2014  FINDINGS: Mediastinum/Nodes: No axillary supraclavicular lymphadenopathy. There is mild hilar lymphadenopathy on the right with a 2.4 x 1.6 cm lymph node. There is no right hilar mass. No central pulmonary embolism. There is mild subcarinal adenopathy measuring 18 mm  Lungs/Pleura: Several small nodules along the right oblique fissure (image 45, series 3). Several subpleural nodules peripherally in the right upper lobe and right lower lobe (image 46). Left upper lobe parenchymal nodule measures 4 mm image 16, series 3. Subpleural nodule in the left  lower lobe image 40.  Upper abdomen: Limited view of the liver, kidneys, pancreas are unremarkable.  Musculoskeletal: No aggressive osseous lesion.  IMPRESSION: Mild right hilar and subcarinal lymphadenopathy coupled with the scattered of pulmonary nodules many of which are in a are subpleural location in suggestive of sarcoidosis. If the workup for sarcoidosis is negative then recommend followup CT of the chest with contrast in 3 months to demonstrate stability of the adenopathy and the multiple small nodules.   Electronically Signed   By: Genevive BiStewart  Edmunds M.D.   On: 04/17/2014 15:36     EKG Interpretation None      MDM   Final diagnoses:  Cough  Abnormal chest x-ray  Acute systolic congestive heart failure  Pulmonary vascular congestion    Pt comes in with cc of cough. Sent her by PCP with concerns for CHF. Has cough, chronic. No lung dz. Lung exam - mild rales at the bases. CXR does show pulm congestion and maybe right sided mass. BNP is elevated.  ? New onset CHF from long standing HTN vs. Pulm mass causing obstructive patter and resultant CHF. The CXR could also suggest other cause of cough, so f/u CT has been ordered, and admitting team will  follow.    Derwood KaplanAnkit Patricio Popwell, MD 04/19/14 [phone]

## 2014-04-19 NOTE — Consult Note (Addendum)
Admit date: 04/17/2014 Referring Physician  Lendell CapriceSullivan Primary Physician Irving CopasHACKER,ROBERT KELLER, MD Primary Cardiologist  new Reason for Consultation  heart failure  HPI: 54 year old with history of untreated hypertension for years, sleep apnea, admitted with cough, dyspnea on exertion, orthopnea. BNP was 1000. Blood pressure was 172/115. Chest x-ray demonstrated enlarged cardiac silhouette and pulmonary vascular congestion. 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  States that he stopped taking many of his blood pressure medications as they just were not working.  About 10 years ago he saw Dr. Philemon KingdomKowalski's [place]  Currently he is feeling better. Actually eager to go home.    PMH:   Past Medical History  Diagnosis Date  . Hypertension   . Enlarged heart   . Renal disorder   . Renal insufficiency   . Sleep apnea     "lost 75#; no need for mask anymore" (04/18/14)    PSH:   Past Surgical History  Procedure Laterality Date  . Tumor excision Left <2010    "fatty growth"  . Facial fracture surgery Right ~ 1990  . Vasectomy     Allergies:  Other Prior to Admit Meds:   Prior to Admission medications   Medication Sig Start Date End Date Taking? Authorizing Provider  albuterol (PROVENTIL HFA;VENTOLIN HFA) 108 (90 BASE) MCG/ACT inhaler Inhale 1-2 puffs into the lungs every 6 (six) hours as needed for wheezing or shortness of breath.   Yes Historical Provider, MD  aspirin EC 81 MG tablet Take 81 mg by mouth daily.   Yes Historical Provider, MD  diphenhydrAMINE (BENADRYL) 25 MG tablet Take 25 mg by mouth every 6 (six) hours as needed for allergies.   Yes Historical Provider, MD  fluticasone (FLONASE) 50 MCG/ACT nasal spray Place 1 spray into both nostrils daily.   Yes Historical Provider, MD  guaifenesin (ROBITUSSIN) 100 MG/5ML syrup Take 200 mg by mouth 3 (three) times daily as needed for cough.   Yes Historical Provider, MD  loratadine (CLARITIN) 10 MG  tablet Take 10 mg by mouth daily.   Yes Historical Provider, MD  Phenylephrine-Pheniramine-DM [facility](THERAFLU COLD & COUGH PO) Take 1 Dose by mouth once.   Yes Historical Provider, MD   Fam HX:    Family History  Problem Relation Age of Onset  . Colon cancer Father   . Lupus Sister   . Stroke Mother    Social HX:    History   Social History  . Marital Status: Married    Spouse Name: N/A    Number of Children: N/A  . Years of Education: N/A   Occupational History  . Not on file.   Social History Main Topics  . Smoking status: Never Smoker   . Smokeless tobacco: Never Used  . Alcohol Use: 4.2 oz/week    7 Glasses of wine per week  . Drug Use: No  . Sexual Activity: Yes   Other Topics Concern  . Not on file   Social History Narrative     ROS: Denies fevers, chills, positive orthopnea, PND, denies syncope, bleeding. All 11 ROS were addressed and are negative except what is stated in the HPI   Physical Exam: Blood pressure 143/98, pulse 61, temperature 97.6 F (36.4 C), temperature source Oral, resp. rate 17, height 5\' 6"  (1.676 m), weight 157 lb 12.8 oz (71.578 kg), SpO2 98 %.   General: Well developed, well nourished, in no acute distress Head: Eyes PERRLA, No xanthomas.  Normal cephalic and atramatic  Lungs:   Clear bilaterally to auscultation and percussion. Normal respiratory effort. No wheezes, no rales. Heart:   HRRR S1 S2 Pulses are 2+ & equal. No murmur, rubs, gallops.  No carotid bruit. No JVD.  No abdominal bruits.  Abdomen: Bowel sounds are positive, abdomen soft and non-tender without masses. No hepatosplenomegaly. Msk:  Back normal. Normal strength and tone for age. Extremities:  No clubbing, cyanosis or edema.  DP +1 Neuro: Alert and oriented X 3, non-focal, MAE x 4 GU: Deferred Rectal: Deferred Psych:  Good affect, responds appropriately      Labs: Lab Results  Component Value Date   WBC 4.9 04/17/2014   HGB 13.5 04/17/2014   HCT 39.3 04/17/2014    MCV 86.4 04/17/2014   PLT 203 04/17/2014     Recent Labs Lab 04/17/14 2055  04/19/14 0520  NA  --   < > 142  K  --   < > 3.4*  CL  --   < > 112  CO2  --   < > 25  BUN  --   < > 26*  CREATININE  --   < > 1.40*  CALCIUM  --   < > 9.1  PROT 6.1  --   --   BILITOT 0.6  --   --   ALKPHOS 64  --   --   ALT 16  --   --   AST 21  --   --   GLUCOSE  --   < > 100*  < > = values in this interval not displayed. No results for input(s): CKTOTAL, CKMB, TROPONINI in the last 72 hours. No results found for: CHOL, HDL, LDLCALC, TRIG No results found for: DDIMER   Radiology:  Dg Chest 2 View  04/17/2014   CLINICAL DATA:  Cough for 2 months, hypertension  EXAM: CHEST  2 VIEW  COMPARISON:  None  FINDINGS: Enlargement of cardiac silhouette.  Pulmonary vascular congestion.  Enlargement of RIGHT hilum question adenopathy versus mass.  Tortuosity of thoracic aorta.  Slight accentuation of interstitial markings diffusely.  No segmental consolidation, pleural effusion or pneumothorax.  No acute osseous findings.  IMPRESSION: Enlargement of cardiac silhouette with pulmonary vascular congestion.  Enlarged RIGHT hilum cannot exclude mass or hilar adenopathy; CT chest with contrast recommended to evaluate.   Electronically Signed   By: Ulyses Southward M.D.   On: 04/17/2014 13:13   Ct Chest W Contrast  04/17/2014   CLINICAL DATA:  Coughing, hypertension.  EXAM: CT CHEST WITH CONTRAST  TECHNIQUE: Multidetector CT imaging of the chest was performed during intravenous contrast administration.  CONTRAST:  80mL OMNIPAQUE IOHEXOL 300 MG/ML  SOLN  COMPARISON:  Radiograph 04/17/2014  FINDINGS: Mediastinum/Nodes: No axillary supraclavicular lymphadenopathy. There is mild hilar lymphadenopathy on the right with a 2.4 x 1.6 cm lymph node. There is no right hilar mass. No central pulmonary embolism. There is mild subcarinal adenopathy measuring 18 mm  Lungs/Pleura: Several small nodules along the right oblique fissure (image 45,  series 3). Several subpleural nodules peripherally in the right upper lobe and right lower lobe (image 46). Left upper lobe parenchymal nodule measures 4 mm image 16, series 3. Subpleural nodule in the left lower lobe image 40.  Upper abdomen: Limited view of the liver, kidneys, pancreas are unremarkable.  Musculoskeletal: No aggressive osseous lesion.  IMPRESSION: Mild right hilar and subcarinal lymphadenopathy coupled with the scattered of pulmonary nodules many of which are in a  are subpleural location in suggestive of sarcoidosis. If the workup for sarcoidosis is negative then recommend followup CT of the chest with contrast in 3 months to demonstrate stability of the adenopathy and the multiple small nodules.   Electronically Signed   By: Genevive Bi M.D.   On: 04/17/2014 15:36   Personally viewed.  EKG:  None observed. Personally viewed.  TELE: Occasional 5 beat runs of nonsustained ventricular tachycardia. PVCs noted sinus rhythm.  Echocardiogram: 04/18/14 - Left ventricle: The cavity size was normal. Wall thickness was increased in a pattern of mild LVH. There was moderate concentric hypertrophy. Systolic function was normal. The estimated ejection fraction was in the range of 10% to 15%. Regional wall motion abnormalities cannot be excluded. Doppler parameters are consistent with a reversible restrictive pattern, indicative of decreased left ventricular diastolic compliance and/or increased left atrial pressure (grade 3 diastolic dysfunction). - Left atrium: The atrium was moderately dilated.  Scheduled Meds: . amoxicillin-clavulanate  1 tablet Oral Q12H  . aspirin EC  81 mg Oral Daily  . carvedilol  12.5 mg Oral BID WC  . enoxaparin (LOVENOX) injection  40 mg Subcutaneous Q24H  . fluticasone  1 spray Each Nare Daily  . furosemide  40 mg Oral Daily  . loratadine  10 mg Oral Daily  . 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  Acute systolic heart failure/cardiomyopathy  - Currently on carvedilol 12.5 mg twice a day. Uptitrated to 25 mg twice a day  - Creatinine ranging from 1.27-1.5 on this admission, currently 1.4. Currently receiving Lasix 40 mg once a day, decreased from IV Lasix and angiotensin receptor blocker was held.. It is likely that his left ventricular end-diastolic volume is significantly elevated. He may very well need more Lasix. We will wait until after cardiac catheterization.  - Given his severely reduced ejection fraction, I would like to exclude coronary artery disease/ischemia as possible cause. We will set him up for cardiac catheterization, radial artery approach. Discussed risks and benefits of procedure including stroke, heart attack, death, renal impairment. He is no longer having orthopnea.  - If no coronary artery disease, continue to up titrate medications.  - In 3 months, repeat echocardiogram. If still EF less than 35%, ICD discussion.  - Currently, with ejection fraction of 15%, he is at increased risk for sudden cardiac death.  - He reports no family members with cardiomyopathy.  Hypokalemia  - Replete potassium.  - Low potassium-consider hyperaldosteronism. Consider checking renin:aldosterone  Possible sarcoidosis  - Possibly could have an infiltrative cardiomyopathy (more likely hypertensive however).  - TSH normal   Donato Schultz, MD  04/19/2014  12:25 PM

## 2014-04-20 ENCOUNTER — Encounter (HOSPITAL_COMMUNITY)
Admission: EM | Disposition: A | Discharge status: Home / Self Care | Payer: Self-pay | Source: Home / Self Care | Attending: Internal Medicine

## 2014-04-20 DIAGNOSIS — I420 Dilated cardiomyopathy: Secondary | ICD-10-CM

## 2014-04-20 DIAGNOSIS — I429 Cardiomyopathy, unspecified: Secondary | ICD-10-CM

## 2014-04-20 DIAGNOSIS — R9389 Abnormal findings on diagnostic imaging of other specified body structures: Secondary | ICD-10-CM | POA: Insufficient documentation

## 2014-04-20 DIAGNOSIS — D869 Sarcoidosis, unspecified: Secondary | ICD-10-CM | POA: Diagnosis present

## 2014-04-20 HISTORY — PX: LEFT HEART CATHETERIZATION WITH CORONARY ANGIOGRAM: SHX5451

## 2014-04-20 LAB — LIPID PANEL
Cholesterol: 221 mg/dL — ABNORMAL HIGH (ref 0–200)
HDL: 67 mg/dL (ref >39)
LDL Cholesterol: 145 mg/dL — ABNORMAL HIGH (ref 0–99)
Total CHOL/HDL Ratio: 3.3 RATIO
Triglycerides: 44 mg/dL (ref <150)
VLDL: 9 mg/dL (ref 0–40)

## 2014-04-20 LAB — BASIC METABOLIC PANEL
Anion gap: 5 (ref 5–15)
BUN: 23 mg/dL (ref 6–23)
CALCIUM: 9.1 mg/dL (ref 8.4–10.5)
CO2: 27 mmol/L (ref 19–32)
CREATININE: 1.33 mg/dL (ref 0.50–1.35)
Chloride: 109 mEq/L (ref 96–112)
GFR calc Af Amer: 70 mL/min — ABNORMAL LOW (ref >90)
GFR, EST NON AFRICAN AMERICAN: 60 mL/min — AB (ref >90)
GLUCOSE: 95 mg/dL (ref 70–99)
Potassium: 3.4 mmol/L — ABNORMAL LOW (ref 3.5–5.1)
SODIUM: 141 mmol/L (ref 135–145)

## 2014-04-20 LAB — ANGIOTENSIN CONVERTING ENZYME: Angiotensin-Converting Enzyme: 25 U/L (ref 8–52)

## 2014-04-20 LAB — PROTIME-INR
INR: 1.13 (ref 0.00–1.49)
Prothrombin Time: 14.6 seconds (ref 11.6–15.2)

## 2014-04-20 LAB — HEMOGLOBIN A1C
Hgb A1c MFr Bld: 5.4 % (ref <5.7)
MEAN PLASMA GLUCOSE: 108 mg/dL (ref <117)

## 2014-04-20 SURGERY — LEFT HEART CATHETERIZATION WITH CORONARY ANGIOGRAM
Anesthesia: LOCAL

## 2014-04-20 MED ORDER — HEPARIN SODIUM (PORCINE) 1000 UNIT/ML IJ SOLN
INTRAMUSCULAR | Status: AC
  Filled 2014-04-20: qty 1

## 2014-04-20 MED ORDER — LIDOCAINE HCL (PF) 1 % IJ SOLN
INTRAMUSCULAR | Status: AC
  Filled 2014-04-20: qty 30

## 2014-04-20 MED ORDER — FENTANYL CITRATE 0.05 MG/ML IJ SOLN
INTRAMUSCULAR | Status: AC
  Filled 2014-04-20: qty 2

## 2014-04-20 MED ORDER — VERAPAMIL HCL 2.5 MG/ML IV SOLN
INTRAVENOUS | Status: AC
  Filled 2014-04-20: qty 2

## 2014-04-20 MED ORDER — POTASSIUM CHLORIDE CRYS ER 20 MEQ PO TBCR
40.0000 meq | EXTENDED_RELEASE_TABLET | Freq: Two times a day (BID) | ORAL | Status: DC
  Administered 2014-04-21: 40 meq via ORAL
  Filled 2014-04-20 (×2): qty 2

## 2014-04-20 MED ORDER — SODIUM CHLORIDE 0.9 % IV SOLN
INTRAVENOUS | Status: AC
  Administered 2014-04-20: 18:00:00 via INTRAVENOUS

## 2014-04-20 MED ORDER — HEPARIN (PORCINE) IN NACL 2-0.9 UNIT/ML-% IJ SOLN
INTRAMUSCULAR | Status: AC
  Filled 2014-04-20: qty 1500

## 2014-04-20 MED ORDER — NITROGLYCERIN 1 MG/10 ML FOR IR/CATH LAB
INTRA_ARTERIAL | Status: AC
  Filled 2014-04-20: qty 10

## 2014-04-20 MED ORDER — MIDAZOLAM HCL 2 MG/2ML IJ SOLN
INTRAMUSCULAR | Status: AC
  Filled 2014-04-20: qty 2

## 2014-04-20 MED ORDER — ONDANSETRON HCL 4 MG/2ML IJ SOLN: 4.0000 mg | Freq: Four times a day (QID) | INTRAMUSCULAR | Status: DC | PRN

## 2014-04-20 MED ORDER — POTASSIUM CHLORIDE CRYS ER 20 MEQ PO TBCR
40.0000 meq | EXTENDED_RELEASE_TABLET | ORAL | Status: AC
  Administered 2014-04-20 (×2): 40 meq via ORAL
  Filled 2014-04-20 (×3): qty 2

## 2014-04-20 NOTE — Progress Notes (Addendum)
PROGRESS NOTE    Jake SimmondsMichael Levine HKV:[id number] DOB: August 01, 1960 DOA: 04/17/2014 PCP: Irving CopasHACKER,ROBERT KELLER, MD  HPI/Brief narrative 54 y.o. male with h/o HTN, untreated for years, "enlarged heart", obstructive sleep apnea, previously on CPAP but reportedly improved with 40 pound weight loss, presents to the emergency room with several week history of nonproductive cough, dyspnea on exertion, orthopnea, postnasal drip. Denies previous history of heart failure. He denies history of asthma or COPD or previous smoking but reports that the albuterol helped somewhat. He reports having had PFTs in the past and that he was told he "might have asthma". In the emergency room, BNP above 1000. Blood pressure 172/115. Chest x-ray showed enlarged cardiac silhouette and pulmonary vascular congestion as well as right hilar fullness. Therefore CAT scan was ordered and shows hilar and subcarinal lymphadenopathy with subcentimeter nodules, possibly consistent with sarcoidosis.    Assessment/Plan:  1. Acute systolic CHF secondary to new cardiomyopathy (LVEF 15%): rule out ischemic versus nonischemic cardiomyopathy (related to uncontrolled hypertension, ? sarcoidosis). TSH normal. Orthopnea resolved. Treated briefly with IV Lasix but creatinine rapidly increased and hence switched to oral Lasix. Creatinine improving/plateaued. Cardiology consulted-increasing carvedilol. Plan for cardiac cath 1/18 since creatinine is decreasing and can use minimal contrast. Will need repeat echo in 3 months to reassess for improvement in EF. 2. Accelerated hypertension: Secondary to noncompliance. Patient was started on carvedilol and ARB. Creatinine however bumped up and hence held ARB which may be initiated at a later time. Increasing carvedilol. Blood pressure control not adequate and will need titration or adjusting medications.? Imdur. 3. Acute on chronic kidney disease: Creatinine has jumped up from 1.27 > 1.5 secondary to  diuretics and ARB. Changed Lasix to by mouth. Hold ARB. Creatinine improving/plateaued. Patient states that he has seen a nephrologist Dr. Thedore MinsSingh 7 years ago. Baseline creatinine not known but current creatinine of 1.3 is close to his admission creatinine of 1.27 and may be baseline. 4. Hypokalemia: Replace and follow BMP 5. Acute sinusitis: Complete Augmentin. Asymptomatic at this time. 6. Abnormal CT chest/possible sarcoidosis: Outpatient follow-up and evaluation as deemed necessary   Code Status: Full Family Communication: None at bedside Disposition Plan: Home when medically stable   Consultants:  Cardiology  Procedures:  None  Antibiotics:  Augmentin   Subjective: No chest pain, dyspnea or orthopnea. Denies any other complaints.  Objective: Filed Vitals:   04/19/14 1300 04/19/14 2031 04/19/14 2200 04/20/14 0702  BP: 132/76 165/106 152/65 148/106  Pulse: 71 68  62  Temp: 97.9 F (36.6 C) 98 F (36.7 C)  97.9 F (36.6 C)  TempSrc: Oral Oral  Oral  Resp: 20 20  19   Height:      Weight:    72 kg (158 lb 11.7 oz)  SpO2: 99% 98%  98%    Intake/Output Summary (Last 24 hours) at 04/20/14 1243 Last data filed at 04/20/14 1031  Gross per 24 hour  Intake    480 ml  Output   2600 ml  Net  -2120 ml   Filed Weights   04/17/14 1928 04/19/14 0639 04/20/14 0702  Weight: 71.895 kg (158 lb 8 oz) 71.578 kg (157 lb 12.8 oz) 72 kg (158 lb 11.7 oz)     Exam:  General exam: Pleasant young male lying comfortably supine in bed. Respiratory system: clear to auscultation. No increased work of breathing. Cardiovascular system: S1 & S2 heard, RRR. No JVD, murmurs, gallops, clicks or pedal edema. Telemetry: Sinus rhythm. Gastrointestinal system: Abdomen is nondistended, soft and  nontender. Normal bowel sounds heard. Central nervous system: Alert and oriented. No focal neurological deficits. Extremities: Symmetric 5 x 5 power.   Data Reviewed: Basic Metabolic Panel:  Recent  Labs Lab 04/17/14 1019 04/18/14 0451 04/19/14 0520 04/19/14 1533 04/20/14 0128  NA 142 144 142  --  141  K 3.6 3.4* 3.4*  --  3.4*  CL 103 107 112  --  109  CO2 --  27  GLUCOSE 107* 106* 100*  --  95  BUN 16 20 26*  --  23  CREATININE 1.27 1.50* 1.40*  --  1.33  CALCIUM 9.4 9.1 9.1  --  9.1  MG  --   --   --  2.0  --    Liver Function Tests:  Recent Labs Lab 04/17/14 2055  AST 21  ALT 16  ALKPHOS 64  BILITOT 0.6  PROT 6.1  ALBUMIN 3.6   No results for input(s): LIPASE, AMYLASE in the last 168 hours. No results for input(s): AMMONIA in the last 168 hours. CBC:  Recent Labs Lab 04/17/14 1019  WBC 4.9  HGB 13.5  HCT 39.3  MCV 86.4  PLT 203   Cardiac Enzymes: No results for input(s): CKTOTAL, CKMB, CKMBINDEX, TROPONINI in the last 168 hours. BNP (last 3 results) No results for input(s): PROBNP in the last 8760 hours. CBG: No results for input(s): GLUCAP in the last 168 hours.  No results found for this or any previous visit (from the past 240 hour(s)).        Studies: No results found.      Scheduled Meds: . amoxicillin-clavulanate  1 tablet Oral Q12H  . [START ON 04/21/2014] aspirin EC  81 mg Oral Daily  . carvedilol  25 mg Oral BID WC  . enoxaparin (LOVENOX) injection  40 mg Subcutaneous Q24H  . fluticasone  1 spray Each Nare Daily  . furosemide  40 mg Oral Daily  . loratadine  10 mg Oral Daily  . [START ON 04/21/2014] potassium chloride  40 mEq Oral BID  . sodium chloride  3 mL Intravenous Q12H   Continuous Infusions: . sodium chloride 50 mL/hr at 04/20/14 0532    Principal Problem:   Dyspnea Active Problems:   Acute systolic congestive heart failure   Accelerated hypertension   Acute sinusitis   Abnormal CT scan, chest   Sarcoid   Cardiomyopathy- EF 10-1%- etiology not yet determined   Renal insufficiency-stage 2    Time spent: 20 minutes    Caius Silbernagel, MD, FACP, FHM. [facility] Pager [phone]  If  7PM-7AM, please contact night-coverage [email or web address] Password [email or web address] 04/20/2014, 12:43 PM    LOS: 3 days

## 2014-04-20 NOTE — CV Procedure (Signed)
    Cardiac Catheterization Procedure Note  Name: Jake Levine MRN: [id number] DOB: Aug 19, 1960  Procedure: Left Heart Cath, Selective Coronary Angiography  Indication: Severe cardiomyopathy   Procedural Details: The right wrist was prepped, draped, and anesthetized with 1% lidocaine. Using the modified Seldinger technique, a 5/6 French Slender sheath was introduced into the right radial artery. 3 mg of verapamil was administered through the sheath, weight-based unfractionated heparin was administered intravenously. Standard Judkins catheters were used for selective coronary angiography and recording of left ventricular pressure. Catheter exchanges were performed over an exchange length guidewire. There were no immediate procedural complications. A TR band was used for radial hemostasis at the completion of the procedure.  The patient was transferred to the post catheterization recovery area for further monitoring.  Procedural Findings: Hemodynamics: AO 138/89 with a mean of 108 LV 140/29  Coronary angiography: Coronary dominance: right  Left mainstem: The left main is large in caliber. The vessel arises from the left cusp and has no obstructive disease. Divides into the LAD and left circumflex.  Left anterior descending (LAD): The LAD courses to the left ventricular apex. The vessel has no significant obstruction. There are minor irregularities but the vessel is essentially patent throughout its course.  Left circumflex (LCx): There is a large intermediate branch with no stenosis. The intermediate divides into 20 vessel. The AV circumflex beyond the intermediate is small and supplies 2 small obtuse marginal branches without significant stenosis.  Right coronary artery (RCA): The RCA is patent. The vessel has marked proximal tortuosity. The RCA is dominant as it supplies a large PDA branch and a medium caliber PLA branch. There are no stenoses throughout the RCA distribution.  Left  ventriculography: deferred  Contrast: 55 cc Omnipaque  Estimated Blood Loss: minimal  Final Conclusions:   1. Widely patent coronary arteries 2. Elevated LVEDP\ 3. Known severe dilated cardiomyopathy  Recommendations: suspect severe hypertensive heart disease. Med Rx recommended.  Jake BollmanMichael Joffre Lucks MD, [facility] 04/20/2014, 5:48 PM

## 2014-04-20 NOTE — Interval H&P Note (Signed)
History and Physical Interval Note:  04/20/2014 4:54 PM  Jake SimmondsMichael Passe  has presented today for surgery, with the diagnosis of HF  The various methods of treatment have been discussed with the patient and family. After consideration of risks, benefits and other options for treatment, the patient has consented to  Procedure(s): LEFT HEART CATHETERIZATION WITH CORONARY ANGIOGRAM (N/A) as a surgical intervention .  The patient's history has been reviewed, patient examined, no change in status, stable for surgery.  I have reviewed the patient's chart and labs.  Questions were answered to the patient's satisfaction.     Tonny BollmanMichael Anette Barra

## 2014-04-20 NOTE — H&P (View-Only) (Signed)
    Subjective:  No dyspnea  Objective:  Vital Signs in the last 24 hours: Temp:  [97.9 F (36.6 C)-98 F (36.7 C)] 97.9 F (36.6 C) (01/18 0702) Pulse Rate:  [62-71] 62 (01/18 0702) Resp:  [19-20] 19 (01/18 0702) BP: (132-165)/(65-106) 148/106 mmHg (01/18 0702) SpO2:  [98 %-99 %] 98 % (01/18 0702) Weight:  [158 lb 11.7 oz (72 kg)] 158 lb 11.7 oz (72 kg) (01/18 0702)  Intake/Output from previous day:  Intake/Output Summary (Last 24 hours) at 04/20/14 1044 Last data filed at 04/20/14 1031  Gross per 24 hour  Intake    480 ml  Output   2600 ml  Net  -2120 ml    Physical Exam: General appearance: alert, cooperative and no distress Lungs: clear to auscultation bilaterally Heart: regular rate and rhythm   Rate: 62  Rhythm: normal sinus rhythm  Lab Results: No results for input(s): WBC, HGB, PLT in the last 72 hours.  Recent Labs  04/19/14 0520 04/20/14 0128  NA 142 141  K 3.4* 3.4*  CL 112 109  CO2 25 27  GLUCOSE 100* 95  BUN 26* 23  CREATININE 1.40* 1.33   No results for input(s): TROPONINI in the last 72 hours.  Invalid input(s): CK, MB  Recent Labs  04/20/14 0128  INR 1.13    Imaging: Imaging results have been reviewed  Cardiac Studies: Echo 04/18/14 Study Conclusions  - Left ventricle: The cavity size was normal. Wall thickness was increased in a pattern of mild LVH. There was moderate concentric hypertrophy. Systolic function was normal. The estimated ejection fraction was in the range of 10% to 15%. Regional wall motion abnormalities cannot be excluded. Doppler parameters are consistent with a reversible restrictive pattern, indicative of decreased left ventricular diastolic compliance and/or increased left atrial pressure (grade 3 diastolic dysfunction). - Left atrium: The atrium was moderately dilated.  Assessment/Plan:  54 year old with history of untreated hypertension for years, sleep apnea, admitted with CHF. Blood  pressure was 172/115. 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 Echo showed an EF of 10-15%. States that he stopped taking many of his blood pressure medications as they just were not working.  Principal Problem:   Dyspnea Active Problems:   Acute systolic congestive heart failure   Cardiomyopathy- EF 10-1%- etiology not yet determined   Accelerated hypertension   Renal insufficiency-stage 2   Acute sinusitis   Abnormal CT scan, chest   Sarcoid   PLAN: Cath today.   Corine ShelterLuke Hasani Diemer PA-C Beeper [phone] 04/20/2014, 10:44 AM

## 2014-04-20 NOTE — Progress Notes (Signed)
    Subjective:  No dyspnea  Objective:  Vital Signs in the last 24 hours: Temp:  [97.9 F (36.6 C)-98 F (36.7 C)] 97.9 F (36.6 C) (01/18 0702) Pulse Rate:  [62-71] 62 (01/18 0702) Resp:  [19-20] 19 (01/18 0702) BP: (132-165)/(65-106) 148/106 mmHg (01/18 0702) SpO2:  [98 %-99 %] 98 % (01/18 0702) Weight:  [158 lb 11.7 oz (72 kg)] 158 lb 11.7 oz (72 kg) (01/18 0702)  Intake/Output from previous day:  Intake/Output Summary (Last 24 hours) at 04/20/14 1044 Last data filed at 04/20/14 1031  Gross per 24 hour  Intake    480 ml  Output   2600 ml  Net  -2120 ml    Physical Exam: General appearance: alert, cooperative and no distress Lungs: clear to auscultation bilaterally Heart: regular rate and rhythm   Rate: 62  Rhythm: normal sinus rhythm  Lab Results: No results for input(s): WBC, HGB, PLT in the last 72 hours.  Recent Labs  04/19/14 0520 04/20/14 0128  NA 142 141  K 3.4* 3.4*  CL 112 109  CO2 25 27  GLUCOSE 100* 95  BUN 26* 23  CREATININE 1.40* 1.33   No results for input(s): TROPONINI in the last 72 hours.  Invalid input(s): CK, MB  Recent Labs  04/20/14 0128  INR 1.13    Imaging: Imaging results have been reviewed  Cardiac Studies: Echo 04/18/14 Study Conclusions  - Left ventricle: The cavity size was normal. Wall thickness was increased in a pattern of mild LVH. There was moderate concentric hypertrophy. Systolic function was normal. The estimated ejection fraction was in the range of 10% to 15%. Regional wall motion abnormalities cannot be excluded. Doppler parameters are consistent with a reversible restrictive pattern, indicative of decreased left ventricular diastolic compliance and/or increased left atrial pressure (grade 3 diastolic dysfunction). - Left atrium: The atrium was moderately dilated.  Assessment/Plan:  54 year old with history of untreated hypertension for years, sleep apnea, admitted with CHF. Blood  pressure was 172/115. 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 Echo showed an EF of 10-15%. States that he stopped taking many of his blood pressure medications as they just were not working.  Principal Problem:   Dyspnea Active Problems:   Acute systolic congestive heart failure   Cardiomyopathy- EF 10-1%- etiology not yet determined   Accelerated hypertension   Renal insufficiency-stage 2   Acute sinusitis   Abnormal CT scan, chest   Sarcoid   PLAN: Cath today.   Helvi Royals PA-C Beeper [phone] 04/20/2014, 10:44 AM    

## 2014-04-21 ENCOUNTER — Encounter (HOSPITAL_COMMUNITY): Payer: Self-pay | Admitting: Cardiovascular Disease

## 2014-04-21 DIAGNOSIS — N289 Disorder of kidney and ureter, unspecified: Secondary | ICD-10-CM

## 2014-04-21 DIAGNOSIS — D869 Sarcoidosis, unspecified: Secondary | ICD-10-CM

## 2014-04-21 LAB — BASIC METABOLIC PANEL
Anion gap: 8 (ref 5–15)
BUN: 20 mg/dL (ref 6–23)
CO2: 25 mmol/L (ref 19–32)
Calcium: 8.9 mg/dL (ref 8.4–10.5)
Chloride: 108 mEq/L (ref 96–112)
Creatinine, Ser: 1.42 mg/dL — ABNORMAL HIGH (ref 0.50–1.35)
GFR calc Af Amer: 65 mL/min — ABNORMAL LOW (ref >90)
GFR calc non Af Amer: 56 mL/min — ABNORMAL LOW (ref >90)
Glucose, Bld: 80 mg/dL (ref 70–99)
Potassium: 3.5 mmol/L (ref 3.5–5.1)
Sodium: 141 mmol/L (ref 135–145)

## 2014-04-21 MED ORDER — POTASSIUM CHLORIDE CRYS ER 20 MEQ PO TBCR: 40.0000 meq | EXTENDED_RELEASE_TABLET | Freq: Every day | ORAL | Status: DC

## 2014-04-21 MED ORDER — ISOSORB DINITRATE-HYDRALAZINE 20-37.5 MG PO TABS
1.0000 | ORAL_TABLET | Freq: Two times a day (BID) | ORAL | Status: DC
  Administered 2014-04-21: 1 via ORAL
  Filled 2014-04-21 (×2): qty 1

## 2014-04-21 MED ORDER — CARVEDILOL 25 MG PO TABS: 25.0000 mg | ORAL_TABLET | Freq: Two times a day (BID) | ORAL | Status: DC

## 2014-04-21 MED ORDER — FUROSEMIDE 40 MG PO TABS: 40.0000 mg | ORAL_TABLET | Freq: Every day | ORAL | Status: DC

## 2014-04-21 MED ORDER — SIMVASTATIN 20 MG PO TABS: 20.0000 mg | ORAL_TABLET | Freq: Every day | ORAL | Status: DC

## 2014-04-21 MED ORDER — AMOXICILLIN-POT CLAVULANATE 875-125 MG PO TABS: 1.0000 | ORAL_TABLET | Freq: Two times a day (BID) | ORAL | Status: DC

## 2014-04-21 MED ORDER — ISOSORB DINITRATE-HYDRALAZINE 20-37.5 MG PO TABS: 1.0000 | ORAL_TABLET | Freq: Two times a day (BID) | ORAL | Status: DC

## 2014-04-21 NOTE — Discharge Instructions (Signed)
Heart Failure °Heart failure is a condition in which the heart has trouble pumping blood. This means your heart does not pump blood efficiently for your body to work well. In some cases of heart failure, fluid may back up into your lungs or you may have swelling (edema) in your lower legs. Heart failure is usually a long-term (chronic) condition. It is important for you to take good care of yourself and follow your health care provider's treatment plan. °CAUSES  °Some health conditions can cause heart failure. Those health conditions include: °· High blood pressure (hypertension). Hypertension causes the heart muscle to work harder than normal. When pressure in the blood vessels is high, the heart needs to pump (contract) with more force in order to circulate blood throughout the body. High blood pressure eventually causes the heart to become stiff and weak. °· Coronary artery disease (CAD). CAD is the buildup of cholesterol and fat (plaque) in the arteries of the heart. The blockage in the arteries deprives the heart muscle of oxygen and blood. This can cause chest pain and may lead to a heart attack. High blood pressure can also contribute to CAD. °· Heart attack (myocardial infarction). A heart attack occurs when one or more arteries in the heart become blocked. The loss of oxygen damages the muscle tissue of the heart. When this happens, part of the heart muscle dies. The injured tissue does not contract as well and weakens the heart's ability to pump blood. °· Abnormal heart valves. When the heart valves do not open and close properly, it can cause heart failure. This makes the heart muscle pump harder to keep the blood flowing. °· Heart muscle disease (cardiomyopathy or myocarditis). Heart muscle disease is damage to the heart muscle from a variety of causes. These can include drug or alcohol abuse, infections, or unknown reasons. These can increase the risk of heart failure. °· Lung disease. Lung disease  makes the heart work harder because the lungs do not work properly. This can cause a strain on the heart, leading it to fail. °· Diabetes. Diabetes increases the risk of heart failure. High blood sugar contributes to high fat (lipid) levels in the blood. Diabetes can also cause slow damage to tiny blood vessels that carry important nutrients to the heart muscle. When the heart does not get enough oxygen and food, it can cause the heart to become weak and stiff. This leads to a heart that does not contract efficiently. °· Other conditions can contribute to heart failure. These include abnormal heart rhythms, thyroid problems, and low blood counts (anemia). °Certain unhealthy behaviors can increase the risk of heart failure, including: °· Being overweight. °· Smoking or chewing tobacco. °· Eating foods high in fat and cholesterol. °· Abusing illicit drugs or alcohol. °· Lacking physical activity. °SYMPTOMS  °Heart failure symptoms may vary and can be hard to detect. Symptoms may include: °· Shortness of breath with activity, such as climbing stairs. °· Persistent cough. °· Swelling of the feet, ankles, legs, or abdomen. °· Unexplained weight gain. °· Difficulty breathing when lying flat (orthopnea). °· Waking from sleep because of the need to sit up and get more air. °· Rapid heartbeat. °· Fatigue and loss of energy. °· Feeling light-headed, dizzy, or close to fainting. °· Loss of appetite. °· Nausea. °· Increased urination during the night (nocturia). °DIAGNOSIS  °A diagnosis of heart failure is based on your history, symptoms, physical examination, and diagnostic tests. Diagnostic tests for heart failure may include: °·   Echocardiography. °· Electrocardiography. °· Chest X-ray. °· Blood tests. °· Exercise stress test. °· Cardiac angiography. °· Radionuclide scans. °TREATMENT  °Treatment is aimed at managing the symptoms of heart failure. Medicines, behavioral changes, or surgical intervention may be necessary to  treat heart failure. °· Medicines to help treat heart failure may include: °· Angiotensin-converting enzyme (ACE) inhibitors. This type of medicine blocks the effects of a blood protein called angiotensin-converting enzyme. ACE inhibitors relax (dilate) the blood vessels and help lower blood pressure. °· Angiotensin receptor blockers (ARBs). This type of medicine blocks the actions of a blood protein called angiotensin. Angiotensin receptor blockers dilate the blood vessels and help lower blood pressure. °· Water pills (diuretics). Diuretics cause the kidneys to remove salt and water from the blood. The extra fluid is removed through urination. This loss of extra fluid lowers the volume of blood the heart pumps. °· Beta blockers. These prevent the heart from beating too fast and improve heart muscle strength. °· Digitalis. This increases the force of the heartbeat. °· Healthy behavior changes include: °· Obtaining and maintaining a healthy weight. °· Stopping smoking or chewing tobacco. °· Eating heart-healthy foods. °· Limiting or avoiding alcohol. °· Stopping illicit drug use. °· Physical activity as directed by your health care provider. °· Surgical treatment for heart failure may include: °· A procedure to open blocked arteries, repair damaged heart valves, or remove damaged heart muscle tissue. °· A pacemaker to improve heart muscle function and control certain abnormal heart rhythms. °· An internal cardioverter defibrillator to treat certain serious abnormal heart rhythms. °· A left ventricular assist device (LVAD) to assist the pumping ability of the heart. °HOME CARE INSTRUCTIONS  °· Take medicines only as directed by your health care provider. Medicines are important in reducing the workload of your heart, slowing the progression of heart failure, and improving your symptoms. °· Do not stop taking your medicine unless directed by your health care provider. °· Do not skip any dose of medicine. °· Refill your  prescriptions before you run out of medicine. Your medicines are needed every day. °· Engage in moderate physical activity if directed by your health care provider. Moderate physical activity can benefit some people. The elderly and people with severe heart failure should consult with a health care provider for physical activity recommendations. °· Eat heart-healthy foods. Food choices should be free of trans fat and low in saturated fat, cholesterol, and salt (sodium). Healthy choices include fresh or frozen fruits and vegetables, fish, lean meats, legumes, fat-free or low-fat dairy products, and whole grain or high fiber foods. Talk to a dietitian to learn more about heart-healthy foods. °· Limit sodium if directed by your health care provider. Sodium restriction may reduce symptoms of heart failure in some people. Talk to a dietitian to learn more about heart-healthy seasonings. °· Use healthy cooking methods. Healthy cooking methods include roasting, grilling, broiling, baking, poaching, steaming, or stir-frying. Talk to a dietitian to learn more about healthy cooking methods. °· Limit fluids if directed by your health care provider. Fluid restriction may reduce symptoms of heart failure in some people. °· Weigh yourself every day. Daily weights are important in the early recognition of excess fluid. You should weigh yourself every morning after you urinate and before you eat breakfast. Wear the same amount of clothing each time you weigh yourself. Record your daily weight. Provide your health care provider with your weight record. °· Monitor and record your blood pressure if directed by your health care   provider.  Check your pulse if directed by your health care provider.  Lose weight if directed by your health care provider. Weight loss may reduce symptoms of heart failure in some people.  Stop smoking or chewing tobacco. Nicotine makes your heart work harder by causing your blood vessels to constrict.  Do not use nicotine gum or patches before talking to your health care provider.  Keep all follow-up visits as directed by your health care provider. This is important.  Limit alcohol intake to no more than 1 drink per day for nonpregnant women and 2 drinks per day for men. One drink equals 12 ounces of beer, 5 ounces of wine, or 1 ounces of hard liquor. Drinking more than that is harmful to your heart. Tell your health care provider if you drink alcohol several times a week. Talk with your health care provider about whether alcohol is safe for you. If your heart has already been damaged by alcohol or you have severe heart failure, drinking alcohol should be stopped completely.  Stop illicit drug use.  Stay up-to-date with immunizations. It is especially important to prevent respiratory infections through current pneumococcal and influenza immunizations.  Manage other health conditions such as hypertension, diabetes, thyroid disease, or abnormal heart rhythms as directed by your health care provider.  Learn to manage stress.  Plan rest periods when fatigued.  Learn strategies to manage high temperatures. If the weather is extremely hot:  Avoid vigorous physical activity.  Use air conditioning or fans or seek a cooler location.  Avoid caffeine and alcohol.  Wear loose-fitting, lightweight, and light-colored clothing.  Learn strategies to manage cold temperatures. If the weather is extremely cold:  Avoid vigorous physical activity.  Layer clothes.  Wear mittens or gloves, a hat, and a scarf when going outside.  Avoid alcohol.  Obtain ongoing education and support as needed.  Participate in or seek rehabilitation as needed to maintain or improve independence and quality of life. SEEK MEDICAL CARE IF:   Your weight increases by 03 lb/1.4 kg in 1 day or 05 lb/2.3 kg in a week.  You have increasing shortness of breath that is unusual for you.  You are unable to participate in  your usual physical activities.  You tire easily.  You cough more than normal, especially with physical activity.  You have any or more swelling in areas such as your hands, feet, ankles, or abdomen.  You are unable to sleep because it is hard to breathe.  You feel like your heart is beating fast (palpitations).  You become dizzy or light-headed upon standing up. SEEK IMMEDIATE MEDICAL CARE IF:   You have difficulty breathing.  There is a change in mental status such as decreased alertness or difficulty with concentration.  You have a pain or discomfort in your chest.  You have an episode of fainting (syncope). MAKE SURE YOU:   Understand these instructions.  Will watch your condition.  Will get help right away if you are not doing well or get worse. Document Released: 03/20/2005 Document Revised: 08/04/2013 Document Reviewed: 04/19/2012 [facility] Patient Information 2015 [place], [place]. This information is not intended to replace advice given to you by your health care provider. Make sure you discuss any questions you have with your health care provider.  Cardiomyopathy Cardiomyopathy means a disease of the heart muscle. The heart muscle becomes enlarged or stiff. The heart is not able to pump enough blood or deliver enough oxygen to the body. This leads to heart  failure and is the number one reason for heart transplants.  TYPES OF CARDIOMYOPATHY INCLUDE: DILATED  The most common type. The heart muscle is stretched out and weak so there is less blood pumped out.   Some causes:  Disease of the arteries of the heart (ischemia).  Heart attack with muscle scar.  Leaky or damaged valves.  After a viral illness.  Smoking.  High cholesterol.  Diabetes or overactive thyroid.  Alcohol or drug abuse.  High blood pressure.  May be reversible. HYPERTROPHIC The heart muscle grows bigger so there is less room for blood in the ventricle, and not enough blood is pumped  out.   Causes include:  Mitral valve leaks.  Inherited tendency (from your family).  No explanation (idiopathic).  May be a cause of sudden death in young athletes with no symptoms. RESTRICTIVE The heart muscle becomes stiff, but not always larger. The heart has to work harder and will get weaker. Abnormal heart beats or rhythm (arrhythmia) are common.  Some causes:  Diseases in other parts of the body which may produce abnormal deposits in the heart muscle.  Probably not inherited.  A result of radiation treatment for cancer. SYMPTOMS OF ALL TYPES:  Less able to exercise or tolerate physical activity.  Palpitations.  Irregular heart beat, heart arrhythmias.  Shortness of breath, even at rest.  Chest pain.  Lightheadedness or fainting. TREATMENT  Life-style changes including reducing salt, lowering cholesterol, stop smoking.  Manage contributing causes with medications.  Medicines to help reduce the fluids in the body.  An implanted cardioverter defibrillator (ICD) to improve heart function and correct arrhythmias.  Medications to relax the blood vessels and make it easier for the heart to pump.  Drugs that help regulate heart beat and improve heart relaxation, reducing the work of the heart.  Myomectomy for patients with hypertrophic cardiomyopathy and severe problems. This is a surgical procedure that removes a portion of the thickened muscle wall in order to improve heart output and provide symptom relief.  A heart transplant is an option in carefully applied circumstances. SEEK IMMEDIATE MEDICAL CARE IF:   You have severe chest pain, especially if the pain is crushing or pressure-like and spreads to the arms, back, neck, or jaw, or if you have sweating, feeling sick to your stomach (nausea), or shortness of breath. THIS IS AN EMERGENCY. Do not wait to see if the pain will go away. Get medical help at once. Call your local emergency services (911 in [place] DO  NOT drive yourself to the hospital.  You develop severe shortness of breath.  You begin to cough up bloody sputum.  You are unable to sleep because you cannot breathe.  You gain weight due to fluid retention.  You develop painful swelling in your calf or leg.  You feel your heart racing and it does not go away or happens when you are resting. Document Released: 06/02/2004 Document Revised: 06/12/2011 Document Reviewed: 11/06/2007 [facility] Patient Information 2015 [place], [place]. This information is not intended to replace advice given to you by your health care provider. Make sure you discuss any questions you have with your health care provider.

## 2014-04-21 NOTE — Care Management Note (Unsigned)
    Page 1 of 1   04/21/2014     4:25:53 PM CARE MANAGEMENT NOTE 04/21/2014  Patient:  Jake Levine,Jake Levine   Account Number:  [id number]  Date Initiated:  04/21/2014  Documentation initiated by:  Omero Kowal  Subjective/Objective Assessment:   Pt adm on 04/17/14 with CHF.  PTA, pt independent, lives with spouse.     Action/Plan:   Will follow for dc needs as pt progresses.   Anticipated DC Date:  04/22/2014   Anticipated DC Plan:  HOME/SELF CARE      DC Planning Services  CM consult      Choice offered to / List presented to:             Status of service:  In process, will continue to follow Medicare Important Message given?   (If response is "NO", the following Medicare IM given date fields will be blank) Date Medicare IM given:   Medicare IM given by:   Date Additional Medicare IM given:   Additional Medicare IM given by:    Discharge Disposition:    Per UR Regulation:  Reviewed for med. necessity/level of care/duration of stay  If discussed at Long Length of Stay Meetings, dates discussed:    Comments:

## 2014-04-21 NOTE — Discharge Summary (Signed)
Physician Discharge Summary  Jake Levine ZOX:[id number] DOB: 19-Nov-1960 DOA: 04/17/2014  PCP: [name], MD  Admit date: 04/17/2014 Discharge date: 04/21/2014  Time spent: Greater than 30 minutes  Recommendations for Outpatient Follow-up:  1. [facility]. MDs office will arrange follow-up in 5-7 days along with repeat labs (BMP). 2. Dr. Henrine Screws, PCP in 1 week. Patient will need outpatient evaluation for abnormal CT chest concerning for sarcoidosis. He may need outpatient pulmonary consultation.  Discharge Diagnoses:  Principal Problem:   Dyspnea Active Problems:   Acute systolic congestive heart failure   Accelerated hypertension   Acute sinusitis   Abnormal CT scan, chest   Sarcoid   Cardiomyopathy- EF 10-1%- etiology not yet determined   Renal insufficiency-stage 2   Abnormal chest x-ray   Discharge Condition: Improved & Stable  Diet recommendation: Heart healthy diet.  Filed Weights   04/19/14 0639 04/20/14 0702 04/21/14 0643  Weight: 71.578 kg (157 lb 12.8 oz) 72 kg (158 lb 11.7 oz) 72 kg (158 lb 11.7 oz)    History of present illness:  54 y.o. male with h/o HTN, untreated for years, "enlarged heart", obstructive sleep apnea, previously on CPAP but reportedly improved with 40 pound weight loss, presents to the emergency room with several week history of nonproductive cough, dyspnea on exertion, orthopnea, postnasal drip. Denies previous history of heart failure. He denies history of asthma or COPD or previous smoking but reports that the albuterol helped somewhat. He reports having had PFTs in the past and that he was told he "might have asthma". In the emergency room, BNP above 1000. Blood pressure 172/115. Chest x-ray showed enlarged cardiac silhouette and pulmonary vascular congestion as well as right hilar fullness. Therefore CAT scan was ordered and shows hilar and subcarinal lymphadenopathy with subcentimeter nodules, possibly  consistent with sarcoidosis.   Hospital Course:   1. Acute systolic CHF secondary to NICM likely due to hypertensive heart disease (LVEF 15%): Patient received a single dose of IV Lasix on admission and his creatinine increased from 1.27 to 1.5. Lasix was then switched to PO and ARB that had been newly started was discontinued. Carvedilol was started and titrated up. Cardiology was consulted and patient underwent LHC on 04/20/14 which showed widely patent coronaries and elevated LVEDP. ARB/ACEI on hold due to recent cath and creatinine still slightly increased. Patient was started on BiDil. TSH normal. Will need repeat echo in 3 months to reassess for improvement in EF. Cardiology has seen and cleared patient for discharge and will arrange for close outpatient follow-up with repeat BMP. CHF clinically compensated. 2. Accelerated hypertension: Secondary to noncompliance. Patient was started on carvedilol and ARB. Creatinine however bumped up and hence held ARB which may be initiated at a later time. Continue carvedilol and patient started on BiDil. Monitor closely and will need adjustment as outpatient. 3. Acute on chronic kidney disease: Creatinine has jumped up from 1.27 > 1.5 secondary to diuretics, ARB & CT contrast. Changed Lasix to by mouth. Hold ARB. Creatinine has bumped up post-cath from 1.33 > 1.42. He likely has some degree of chronic kidney disease (baseline creatinine ? 1.3) related to chronic uncontrolled hypertension. 4. Hypokalemia: Replace and follow BMP closely outpatient. 5. Acute sinusitis: Continue Augmentin. Asymptomatic. 6. Abnormal CT chest/possible sarcoidosis: Outpatient follow-up and evaluation as deemed necessary. May need outpatient pulmonary consultation. Need for outpatient follow-up and evaluation thru PCP has been extensively discussed with patient and he verbalizes understanding. Ace level normal. HIV-1 and 2 antibodies negative.  7. Dyslipidemia: Started simvastatin 20 MG  daily.    Consultations:  Cardiology  Procedures:  LHC 04/20/14:   Final Conclusions:  1. Widely patent coronary arteries 2. Elevated LVEDP\ 3. Known severe dilated cardiomyopathy  Recommendations: suspect severe hypertensive heart disease. Med Rx recommended.   2-D echo 04/18/14: Study Conclusions  - Left ventricle: The cavity size was normal. Wall thickness was increased in a pattern of mild LVH. There was moderate concentric hypertrophy. Systolic function was normal. The estimated ejection fraction was in the range of 10% to 15%. Regional wall motion abnormalities cannot be excluded. Doppler parameters are consistent with a reversible restrictive pattern, indicative of decreased left ventricular diastolic compliance and/or increased left atrial pressure (grade 3 diastolic dysfunction). - Left atrium: The atrium was moderately dilated.   Discharge Exam:  Complaints: Denies complaints. No SOB  Filed Vitals:   04/20/14 2145 04/21/14 0643 04/21/14 1500 04/21/14 1522  BP: 144/96 148/100 154/94 147/95  Pulse: 62 62 66 108  Temp:  98.3 F (36.8 C) 98 F (36.7 C) 98.3 F (36.8 C)  TempSrc:  Oral Oral Oral  Resp: 20 19 20 22   Height:      Weight:  72 kg (158 lb 11.7 oz)    SpO2: 99% 99% 99% 99%    General exam: Pleasant young male comfortably ambulating in room.Marland Kitchen Respiratory system: clear to auscultation. No increased work of breathing. Cardiovascular system: S1 & S2 heard, RRR. No JVD, murmurs, gallops, clicks or pedal edema. Telemetry: SB 50-SR 60's Gastrointestinal system: Abdomen is nondistended, soft and nontender. Normal bowel sounds heard. Central nervous system: Alert and oriented. No focal neurological deficits. Extremities: Symmetric 5 x 5 power.  Discharge Instructions      Discharge Instructions    (HEART FAILURE PATIENTS) Call MD:  Anytime you have any of the following symptoms: 1) 3 pound weight gain in 24 hours or 5 pounds in 1  week 2) shortness of breath, with or without a dry hacking cough 3) swelling in the hands, feet or stomach 4) if you have to sleep on extra pillows at night in order to breathe.    Complete by:  As directed      Call MD for:  difficulty breathing, headache or visual disturbances    Complete by:  As directed      Diet - low sodium heart healthy    Complete by:  As directed      Increase activity slowly    Complete by:  As directed             Medication List    STOP taking these medications        diphenhydrAMINE 25 MG tablet  Commonly known as:  BENADRYL     THERAFLU COLD & COUGH PO      TAKE these medications        albuterol 108 (90 BASE) MCG/ACT inhaler  Commonly known as:  PROVENTIL HFA;VENTOLIN HFA  Inhale 1-2 puffs into the lungs every 6 (six) hours as needed for wheezing or shortness of breath.     amoxicillin-clavulanate 875-125 MG per tablet  Commonly known as:  AUGMENTIN  Take 1 tablet by mouth every 12 (twelve) hours.     aspirin EC 81 MG tablet  Take 81 mg by mouth daily.     carvedilol 25 MG tablet  Commonly known as:  COREG  Take 1 tablet (25 mg total) by mouth 2 (two) times daily with a meal.  fluticasone 50 MCG/ACT nasal spray  Commonly known as:  FLONASE  Place 1 spray into both nostrils daily.     furosemide 40 MG tablet  Commonly known as:  LASIX  Take 1 tablet (40 mg total) by mouth daily.     guaifenesin 100 MG/5ML syrup  Commonly known as:  ROBITUSSIN  Take 200 mg by mouth 3 (three) times daily as needed for cough.     isosorbide-hydrALAZINE 20-37.5 MG per tablet  Commonly known as:  BIDIL  Take 1 tablet by mouth 2 (two) times daily.     loratadine 10 MG tablet  Commonly known as:  CLARITIN  Take 10 mg by mouth daily.     potassium chloride SA 20 MEQ tablet  Commonly known as:  K-DUR,KLOR-CON  Take 2 tablets (40 mEq total) by mouth daily.     simvastatin 20 MG tablet  Commonly known as:  ZOCOR  Take 1 tablet (20 mg total) by  mouth daily at 6 PM.       Follow-up Information    Follow up with [facility].   Specialty:  Cardiology   Why:  Office will call you for your followup appointment. Call office if you have not heard back in 3 days. You will also have labwork that day.   Contact information:   [address] [place] [address] [phone]      Follow up with [name], MD. Schedule an appointment as soon as possible for a visit in 1 week.   Specialty:  Family Medicine   Why:  Need evaluation for abnormal CT scan of chest- suspecting Sarcoidosis. May need Pulmonary consultation.   Contact information:   [address] [place] [address] [phone]        The results of significant diagnostics from this hospitalization (including imaging, microbiology, ancillary and laboratory) are listed below for reference.    Significant Diagnostic Studies: Dg Chest 2 View  04/17/2014   CLINICAL DATA:  Cough for 2 months, hypertension  EXAM: CHEST  2 VIEW  COMPARISON:  None  FINDINGS: Enlargement of cardiac silhouette.  Pulmonary vascular congestion.  Enlargement of RIGHT hilum question adenopathy versus mass.  Tortuosity of thoracic aorta.  Slight accentuation of interstitial markings diffusely.  No segmental consolidation, pleural effusion or pneumothorax.  No acute osseous findings.  IMPRESSION: Enlargement of cardiac silhouette with pulmonary vascular congestion.  Enlarged RIGHT hilum cannot exclude mass or hilar adenopathy; CT chest with contrast recommended to evaluate.   Electronically Signed   By: Ulyses Southward M.D.   On: 04/17/2014 13:13   Ct Chest W Contrast  04/17/2014   CLINICAL DATA:  Coughing, hypertension.  EXAM: CT CHEST WITH CONTRAST  TECHNIQUE: Multidetector CT imaging of the chest was performed during intravenous contrast administration.  CONTRAST:  80mL OMNIPAQUE IOHEXOL 300 MG/ML  SOLN  COMPARISON:  Radiograph 04/17/2014   FINDINGS: Mediastinum/Nodes: No axillary supraclavicular lymphadenopathy. There is mild hilar lymphadenopathy on the right with a 2.4 x 1.6 cm lymph node. There is no right hilar mass. No central pulmonary embolism. There is mild subcarinal adenopathy measuring 18 mm  Lungs/Pleura: Several small nodules along the right oblique fissure (image 45, series 3). Several subpleural nodules peripherally in the right upper lobe and right lower lobe (image 46). Left upper lobe parenchymal nodule measures 4 mm image 16, series 3. Subpleural nodule in the left lower lobe image 40.  Upper abdomen: Limited view of the liver, kidneys,  pancreas are unremarkable.  Musculoskeletal: No aggressive osseous lesion.  IMPRESSION: Mild right hilar and subcarinal lymphadenopathy coupled with the scattered of pulmonary nodules many of which are in a are subpleural location in suggestive of sarcoidosis. If the workup for sarcoidosis is negative then recommend followup CT of the chest with contrast in 3 months to demonstrate stability of the adenopathy and the multiple small nodules.   Electronically Signed   By: Genevive BiStewart  Edmunds M.D.   On: 04/17/2014 15:36    Microbiology: No results found for this or any previous visit (from the past 240 hour(s)).   Labs: Basic Metabolic Panel:  Recent Labs Lab 04/17/14 1019 04/18/14 0451 04/19/14 0520 04/19/14 1533 04/20/14 0128 04/21/14 0236  NA 142 144 142  --  141 141  K 3.6 3.4* 3.4*  --  3.4* 3.5  CL 103 107 112  --  109 108  CO2 28 29 25   --  27 25  GLUCOSE 107* 106* 100*  --  95 80  BUN 16 20 26*  --  23 20  CREATININE 1.27 1.50* 1.40*  --  1.33 1.42*  CALCIUM 9.4 9.1 9.1  --  9.1 8.9  MG  --   --   --  2.0  --   --    Liver Function Tests:  Recent Labs Lab 04/17/14 2055  AST 21  ALT 16  ALKPHOS 64  BILITOT 0.6  PROT 6.1  ALBUMIN 3.6   No results for input(s): LIPASE, AMYLASE in the last 168 hours. No results for input(s): AMMONIA in the last 168  hours. CBC:  Recent Labs Lab 04/17/14 1019  WBC 4.9  HGB 13.5  HCT 39.3  MCV 86.4  PLT 203   Cardiac Enzymes: No results for input(s): CKTOTAL, CKMB, CKMBINDEX, TROPONINI in the last 168 hours. BNP: BNP (last 3 results) No results for input(s): PROBNP in the last 8760 hours. CBG: No results for input(s): GLUCAP in the last 168 hours.   Additional labs: 1. Fasting Lipids: Cholesterol 221, triglycerides 44, HDL 67, LDL 145 and VLDL 9 2. Hemoglobin A1c: 5.4 3. ACE level: 25 4. HIV-1 and 2 antibodies: Nonreactive   Signed:  Marcellus ScottHONGALGI,ANAND, MD, FACP, FHM. [facility] Pager [phone]  If 7PM-7AM, please contact night-coverage [email or web address] Password [email or web address] 04/21/2014, 3:49 PM

## 2014-04-21 NOTE — Progress Notes (Signed)
Patient: Jake Levine / Admit Date: 04/17/2014 / Date of Encounter: 04/21/2014, 11:55 AM   Subjective: Feels great. Very anxious to be discharged. No CP or SOB. Laying flat.  Of note I located his EKG from admission done at PCP's office - NSR 70bpm LVH with repol changes with TWI V5-V6. This should be scanned in after discharge when chart goes down to medical records.  Objective: Telemetry: SB HR upper 40s-low 60s Physical Exam: Blood pressure 148/100, pulse 62, temperature 98.3 F (36.8 C), temperature source Oral, resp. rate 19, height  (1.676 m), weight 158 lb 11.7 oz (72 kg), SpO2 99 %. General: Well developed, well nourished M, in no acute distress. Lying flat in bed. Head: Normocephalic, atraumatic, sclera non-icteric, no xanthomas, nares are without discharge. Neck: Negative for carotid bruits. JVP not elevated. Lungs: Clear bilaterally to auscultation without wheezes, rales, or rhonchi. Breathing is unlabored. Heart: RRR S1 S2 without murmurs, rubs, or gallops.  Abdomen: Soft, non-tender, non-distended with normoactive bowel sounds. No rebound/guarding. Extremities: No clubbing or cyanosis. No edema. Distal pedal pulses are 2+ and equal bilaterally. Right radial site without hematoma, ecchymosis or bruit. Neuro: Alert and oriented X 3. Moves all extremities spontaneously. Psych:  Responds to questions appropriately with a normal affect.   Intake/Output Summary (Last 24 hours) at 04/21/14 1155 Last data filed at 04/21/14 0900  Gross per 24 hour  Intake 2813.33 ml  Output   3025 ml  Net -211.67 ml    Inpatient Medications:  . amoxicillin-clavulanate  1 tablet Oral Q12H  . aspirin EC  81 mg Oral Daily  . carvedilol  25 mg Oral BID WC  . enoxaparin (LOVENOX) injection  40 mg Subcutaneous Q24H  . fluticasone  1 spray Each Nare Daily  . furosemide  40 mg Oral Daily  . loratadine  10 mg Oral Daily  . potassium chloride  40 mEq Oral BID  . sodium chloride  3 mL  Intravenous Q12H   Infusions:  . sodium chloride Stopped (04/20/14 1753)    Labs:  Recent Labs  04/19/14 1533 04/20/14 0128 04/21/14 0236  NA  --  141 141  K  --  3.4* 3.5  CL  --  109 108  CO2  --  27 25  GLUCOSE  --  95 80  BUN  --  23 20  CREATININE  --  1.33 1.42*  CALCIUM  --  9.1 8.9  MG 2.0  --   --     Recent Labs  04/19/14 1533  HGBA1C 5.4     Radiology/Studies:  Dg Chest 2 View  04/17/2014   CLINICAL DATA:  Cough for 2 months, hypertension  EXAM: CHEST  2 VIEW  COMPARISON:  None  FINDINGS: Enlargement of cardiac silhouette.  Pulmonary vascular congestion.  Enlargement of RIGHT hilum question adenopathy versus mass.  Tortuosity of thoracic aorta.  Slight accentuation of interstitial markings diffusely.  No segmental consolidation, pleural effusion or pneumothorax.  No acute osseous findings.  IMPRESSION: Enlargement of cardiac silhouette with pulmonary vascular congestion.  Enlarged RIGHT hilum cannot exclude mass or hilar adenopathy; CT chest with contrast recommended to evaluate.   Electronically Signed   By: Ulyses Southward M.D.   On: 04/17/2014 13:13   Ct Chest W Contrast  04/17/2014   CLINICAL DATA:  Coughing, hypertension.  EXAM: CT CHEST WITH CONTRAST  TECHNIQUE: Multidetector CT imaging of the chest was performed during intravenous contrast administration.  CONTRAST:  80mL OMNIPAQUE IOHEXOL 300 MG/ML  SOLN  COMPARISON:  Radiograph 04/17/2014  FINDINGS: Mediastinum/Nodes: No axillary supraclavicular lymphadenopathy. There is mild hilar lymphadenopathy on the right with a 2.4 x 1.6 cm lymph node. There is no right hilar mass. No central pulmonary embolism. There is mild subcarinal adenopathy measuring 18 mm  Lungs/Pleura: Several small nodules along the right oblique fissure (image 45, series 3). Several subpleural nodules peripherally in the right upper lobe and right lower lobe (image 46). Left upper lobe parenchymal nodule measures 4 mm image 16, series 3. Subpleural  nodule in the left lower lobe image 40.  Upper abdomen: Limited view of the liver, kidneys, pancreas are unremarkable.  Musculoskeletal: No aggressive osseous lesion.  IMPRESSION: Mild right hilar and subcarinal lymphadenopathy coupled with the scattered of pulmonary nodules many of which are in a are subpleural location in suggestive of sarcoidosis. If the workup for sarcoidosis is negative then recommend followup CT of the chest with contrast in 3 months to demonstrate stability of the adenopathy and the multiple small nodules.   Electronically Signed   By: Genevive BiStewart  Edmunds M.D.   On: 04/17/2014 15:36     Assessment and Plan   1. Acute systolic CHF secondary to NICM likely due to hypertensive heart disease - echo 04/18/14: mild LVH, EF 10-15%, grade 3 DD, mod dilated LA - cath 04/20/14: widely patent cors, elevated LVEDP - ARB on hold due to recent cath - Cr slightly bumped but still lower from peak of 1.5 during this admission. Will discuss further plans for surveillance of renal function with MD - may benefit from outpatient cardiac MRI to exclude cardiac sarcoid  2. Hilar lymphadenopathy/pulmonary nodules suggestive of sarcoidosis - recommend outpatient f/u with pulmonary to establish the diagnosis  3. Essential HTN - BP remains elevated  - anticipate we will eventually add back ARB if creatinine remains stable post-cath but will discuss with MD - eventually would consider addition of spironolactone with careful renal monitoring as well as Imdur/hydralazine  4. AKI on CKD stage III (Baseline Cr likely 1.3) - see above 5. OSA improved with weight loss per patient 6. Acute sinusitis  Dispo: Once I know the final plan per discussion with Dr. Rennis GoldenHilty, I [place] for [place]. The patient should receive a call from our office.  Signed, Ronie Spiesayna Dunn PA-C

## 2014-04-21 NOTE — Progress Notes (Signed)
Discharge education completed by RN. Pt and spouse received a copy of discharge paperwork and confirm understanding of follow up appointments and discharge medications. Both deny any questions at this time. IV removed, site is within normal limits. Pt will discharge from the unit via wheelchair. 

## 2014-04-22 ENCOUNTER — Telehealth: Payer: Self-pay | Admitting: *Deleted

## 2014-04-22 DIAGNOSIS — I5021 Acute systolic (congestive) heart failure: Secondary | ICD-10-CM

## 2014-04-22 NOTE — Telephone Encounter (Signed)
Informed patient of lab and appt date and time  Patient verbalized understanding

## 2014-04-22 NOTE — Telephone Encounter (Signed)
-----   Message from Sondra Bargesyan M Dunn, PA-C sent at 04/21/2014  2:19 PM EST ----- Can we please call patient for tcm follow up, schedule patient for bmet Monday 1/25 and hospital follow up approximately first part of next week?  Thanks,   ryan

## 2014-04-27 ENCOUNTER — Other Ambulatory Visit (INDEPENDENT_AMBULATORY_CARE_PROVIDER_SITE_OTHER): Payer: PRIVATE HEALTH INSURANCE | Admitting: *Deleted

## 2014-04-27 DIAGNOSIS — I5021 Acute systolic (congestive) heart failure: Secondary | ICD-10-CM

## 2014-04-28 LAB — BASIC METABOLIC PANEL
BUN / CREAT RATIO: 12 (ref 9–20)
BUN: 17 mg/dL (ref 6–24)
CO2: 24 mmol/L (ref 18–29)
Calcium: 9.1 mg/dL (ref 8.7–10.2)
Chloride: 105 mmol/L (ref 97–108)
Creatinine, Ser: 1.41 mg/dL — ABNORMAL HIGH (ref 0.76–1.27)
GFR calc Af Amer: 66 mL/min/{1.73_m2} (ref >59)
GFR calc non Af Amer: 57 mL/min/{1.73_m2} — ABNORMAL LOW (ref >59)
GLUCOSE: 95 mg/dL (ref 65–99)
Potassium: 4.7 mmol/L (ref 3.5–5.2)
Sodium: 144 mmol/L (ref 134–144)

## 2014-04-29 ENCOUNTER — Encounter: Payer: Self-pay | Admitting: Physician Assistant

## 2014-04-29 ENCOUNTER — Ambulatory Visit (INDEPENDENT_AMBULATORY_CARE_PROVIDER_SITE_OTHER): Payer: PRIVATE HEALTH INSURANCE | Admitting: Physician Assistant

## 2014-04-29 VITALS — BP 130/100 | HR 66 | Ht 66.0 in | Wt 169.0 lb

## 2014-04-29 DIAGNOSIS — R0789 Other chest pain: Secondary | ICD-10-CM

## 2014-04-29 DIAGNOSIS — E785 Hyperlipidemia, unspecified: Secondary | ICD-10-CM

## 2014-04-29 DIAGNOSIS — I509 Heart failure, unspecified: Secondary | ICD-10-CM

## 2014-04-29 DIAGNOSIS — R0602 Shortness of breath: Secondary | ICD-10-CM

## 2014-04-29 DIAGNOSIS — D869 Sarcoidosis, unspecified: Secondary | ICD-10-CM

## 2014-04-29 DIAGNOSIS — I110 Hypertensive heart disease with heart failure: Secondary | ICD-10-CM

## 2014-04-29 DIAGNOSIS — N183 Chronic kidney disease, stage 3 unspecified: Secondary | ICD-10-CM | POA: Insufficient documentation

## 2014-04-29 DIAGNOSIS — I119 Hypertensive heart disease without heart failure: Secondary | ICD-10-CM | POA: Insufficient documentation

## 2014-04-29 MED ORDER — ISOSORB DINITRATE-HYDRALAZINE 20-37.5 MG PO TABS: 1.0000 | ORAL_TABLET | Freq: Three times a day (TID) | ORAL | Status: DC

## 2014-04-29 NOTE — Patient Instructions (Signed)
Your physician has recommended you make the following change in your medication:  Increase Lasix to 60 mg for 3 days then return to 40 mg once daily  Increase Bidil to one tablet three times daily   Your [facility]   Your physician recommends that you schedule a follow-up appointment in:  1 month

## 2014-04-29 NOTE — Progress Notes (Signed)
Patient Name: Jake Levine, Jake Levine [birth year], MRN [id number]  Date of Encounter: 04/30/2014  Primary Care Provider:  [name], MD Primary Cardiologist:  Dr. Anne Fu, MD  Chief Complaint  Patient presents with  . other    F/u from [facility] c/o chest discomfort, coughing, unable to sleep,  and edema ankles. Meds reviewed verbally with pt.    HPI:  53 year old male with history of HTN (untreated for years), "enlarged heart," obstructive sleep apnea, previously on CPAP but reportedly improved with 40 pound weight loss who presents for hospital follow up at [facility] from 1/15-1/19 after recent diagnosis of acute systolic CHF/dialted cardiomyopathy in the setting of severe hypertensive heart disease.   Patient had been noting a several week history of nonproductive cough, increased DOE, orthopnea, and post nasal drip. He has been off all antihypertensives for many years as he states they were not controlling his blood pressure and causing too many adverse effects. He has been sleeping upright due to worsening cough and dyspnea at night. He denies edema. Denies previous history of heart failure. Has been taking Benadryl, Claritin, decongestants, Flonase, and even used his stepson's albuterol inhaler. He denies history of asthma, COPD, or previous smoking but reports that the albuterol helped somewhat. He reports having had PFTs in the past and that he was told he "might have asthma". In the emergency room, BNP above 1000. Blood pressure 172/115. Chest x-ray showed enlarged cardiac silhouette and pulmonary vascular congestion as well as right hilar fullness. Therefore CT chest scan was ordered and showed hilar and subcarinal lymphadenopathy with subcentimeter nodules, possibly consistent with sarcoidosis. Patient has no history of same. He apparently previously saw Dr. Buzzy Han PA about 10 years prior. Echo on 04/18/14 showed an EF of 10-15%, Wall thickness wasincreased in a pattern of  mild LVH. There was moderate concentric hypertrophy. Systolic function was normal. Unable to exclude RMA. GR3DD. Moderately dilated LA. Cardiac medications were up titrated and he was diuresed. He underwent cardiac cath on 1/18 that showed widely patent coronary arteries, elevated LVEDP, known severe dilated cardiomyopathy. It was suspected the patient had severe hypertensive heart disease and medical management was recommended.   He comes in today having felt well since since discharge. He has now noticed some post nasal drip, nasal congestion, and cough that has developed over the past 2 days. Cough is not productive. No orthopnea. No early satiety. No lower extremity edema. His toes become sore at times, but no edema. His weight has remained stable hen looking at his home weights. When he was discharged from the hospital he weighed himself at his house and he weighed 160, his weights have consistently stayed between 160 and 162 or 163 since discharge at his house. His has not been exercising, though he does want to restart his exercise program. He was previously quite active with martial arts. His blood pressures are well controlled briefly during the day after taking his antihypertensives, then they begin to spike back up into the 150s-170s. He has felt better overall. He does drink a fair amount of Gatorade. He does put added salt on his food and does not watch his fluid consumption. He is taking his medications without issue.       Past Medical History  Diagnosis Date  . Hypertension   . Enlarged heart   . Sleep apnea     "lost 75#; no need for mask anymore" (04/18/14)  . Hypertensive heart disease     a. echo  04/2014: EF 10-15%, mild LVH, mod concentrtic hypertrophy,  RWMA cannot be excluded, GR3DD, left atrium mod dilated at 53 mm  . CKD (chronic kidney disease), stage III   . HLD (hyperlipidemia)   . NICM (nonischemic cardiomyopathy)     a. cath 04/2014: widely patent coronary arteries,  elevted LVEDP, known severe dilated CM   Past Surgical History  Procedure Laterality Date  . Tumor excision Left <2010    "fatty growth"  . Facial fracture surgery Right ~ 1990  . Vasectomy    . Left heart catheterization with coronary angiogram N/A 04/20/2014    Procedure: LEFT HEART CATHETERIZATION WITH CORONARY ANGIOGRAM;  Surgeon: [name], MD;  [facility] C[facility];  Service: Cardiovascular;  Laterality: N/A;   Family History  Problem Relation Age of Onset  . Colon cancer Father   . Lupus Sister   . Stroke Mother     reports that he has never smoked. He has never used smokeless tobacco. He reports that he drinks about 4.2 oz of alcohol per week. He reports that he does not use illicit drugs.   Allergies:  Allergies  Allergen Reactions  . Other     Blood pressure medications     Home Medications:  Current Outpatient Prescriptions  Medication Sig Dispense Refill  . albuterol (PROVENTIL HFA;VENTOLIN HFA) 108 (90 BASE) MCG/ACT inhaler Inhale 1-2 puffs into the lungs every 6 (six) hours as needed for wheezing or shortness of breath.    Marland Kitchen. aspirin EC 81 MG tablet Take 81 mg by mouth daily.    . carvedilol (COREG) 25 MG tablet Take 1 tablet (25 mg total) by mouth 2 (two) times daily with a meal. 60 tablet 0  . fluticasone (FLONASE) 50 MCG/ACT nasal spray Place 1 spray into both nostrils as needed.     . furosemide (LASIX) 40 MG tablet Take 1 tablet (40 mg total) by mouth daily. 30 tablet 0  . guaifenesin (ROBITUSSIN) 100 MG/5ML syrup Take 200 mg by mouth 3 (three) times daily as needed for cough.    . loratadine (CLARITIN) 10 MG tablet Take 10 mg by mouth daily.    . potassium chloride SA (K-DUR,KLOR-CON) 20 MEQ tablet Take 2 tablets (40 mEq total) by mouth daily. 60 tablet 0  . simvastatin (ZOCOR) 20 MG tablet Take 1 tablet (20 mg total) by mouth daily at 6 PM. 30 tablet 0  . isosorbide-hydrALAZINE (BIDIL) 20-37.5 MG per tablet Take 1 tablet by mouth 3 (three) times  daily. 90 tablet 0   No current facility-administered medications for this visit.    Weights: Wt Readings from Last 3 Encounters:  04/29/14 169 lb (76.658 kg)  04/21/14 158 lb 11.7 oz (72 kg)     Review of Systems:  As above. All other systems reviewed and are otherwise negative except as noted above.  Physical Exam:  Blood pressure 130/100, pulse 66, height 5\' 6"  (1.676 m), weight 169 lb (76.658 kg).  General: Pleasant, NAD Psych: Normal affect. Neuro: Alert and oriented X 3. Moves all extremities spontaneously. HEENT: Normal  Neck: Supple without bruits or JVD. Lungs:  Resp regular and unlabored, faint bilateral crackles at the bases. Heart: RRR no s3, s4, or murmurs. Abdomen: Soft, non-tender, non-distended, BS + x 4.  Extremities: No clubbing, cyanosis or edema.    Accessory Clinical Findings:  EKG - NSR, 66 bpm, 1st degree AV block, LVH with likely early repolarization changes and inferolateral TWI   Other studies Reviewed: Additional studies/ records  that were reviewed today include: Hospitalization from [facility], dates as above; cardiac cath during admission (see cath report for detailed record).  Recent Labs: 04/17/2014: ALT 16; B Natriuretic Peptide 1004.0*; Hemoglobin 13.5; Platelets 203; TSH 1.866 04/19/2014: Magnesium 2.0 04/27/2014: BUN 17; Creatinine 1.41*; Potassium 4.7; Sodium 144    Lipid Panel    Component Value Date/Time   CHOL 221* 04/20/2014 0128   TRIG 44 04/20/2014 0128   HDL 67 04/20/2014 0128   CHOLHDL 3.3 04/20/2014 0128   VLDL 9 04/20/2014 0128   LDLCALC 145* 04/20/2014 0128    Assessment & Plan:  1. Chronic systolic CHF secondary to NICM likely due to hypertensive heart disease:  -EF 10-15%, placing him at increased risk of SCD  -Repeat echo 07/2014, if EF still less than 35% will need to have further ICD discussion (began this discussion today) -Increase Lasix to 60 mg daily for 3 days, then go back to 40 mg daily given crackles at the  bilateral bases -Continue Coreg 25 mg bid, simvastatin 20 mg qhs  -BiDil 20/37.5 mg increased to tid secondary to poorly controlled blood pressures  -Ideally he would benefit from [place], would hope to make this transition by his next follow up pending his blood pressures and SCr -He would also benefit from the heart failure clinic  -Has been eating a fair amount of salt, drinking Gatorade, and not exercising - advised him of lifestyle changes, he agrees to follow them   2. Hilar lymphadenopathy/pulmonary nodules suggestive of sarcoidosis  -Recommend outpatient f/u with pulmonary to establish the diagnosis  -He will need a cardiac MRI in follow up to rule out cardiac sarcoidosis, though this would be with contrast and given his SCr at 1.41 currently we may need to see if his SCr improves in short term follow up with changes made today   3. Essential HTN  -BP remains elevated   -Increased BiDil to 20/37.5 mg tid -Anticipate we will eventually add back ARB (see above comments regarding Entresto) if creatinine remains stable post-cath, plan to recheck SCr 1 week -Eventually would consider addition of spironolactone with careful renal monitoring   4. CKD stage III (Baseline Cr likely 1.3) - see above  -SCr previously 1.3 at baseline, new baseline possibly around 1.4 given his discharge SCr at 1.42 and hospital follow up SCr at 1.41 on 1/25 -Recheck bmet 1 week with the above changes, including lifestyle changes   5. OSA improved with weight loss per patient  6. HLD: -Continue simvastatin 20 mg  Dispo: -Check bmet 1 week -Follow up 1 month   Eula Listen, PA-C [facility] [address] [place], [place] [address] [phone] [facility] 04/30/2014, 11:09 AM

## 2014-04-30 ENCOUNTER — Encounter: Payer: Self-pay | Admitting: Physician Assistant

## 2014-05-01 ENCOUNTER — Telehealth: Payer: Self-pay | Admitting: Physician Assistant

## 2014-05-01 MED ORDER — ISOSORB DINITRATE-HYDRALAZINE 20-37.5 MG PO TABS: 2.0000 | ORAL_TABLET | Freq: Three times a day (TID) | ORAL | Status: DC

## 2014-05-01 NOTE — Telephone Encounter (Signed)
Spoke w/ pt.  He reports that he is feeling worse since his med change on 04/29/14 ov. Reports BP averaging 161/106, w/ highest reading 170/111. Reports that he woke up coughing around 3 am, his stomach was bloated and he had to sit up in order to feel better.  He states that he is feeling better this morning, though he is sluggish due to lack of sleep. He states that he feels worse since switching meds and wants to know what changes can be made before the weekend.  Please advise.  Thank you.

## 2014-05-01 NOTE — Telephone Encounter (Signed)
Pt c/o of Chest Pain: STAT if CP now or developed within 24 hours  1. Are you having CP right now? no  2. Are you experiencing any other symptoms (ex. SOB, nausea, vomiting, sweating)? Sob while coughing  3. How long have you been experiencing CP? 3 am this morning onset   4. Is your CP continuous or coming and going? None since last night (with SOB while coughing)  5. Have you taken Nitroglycerin? no  Patient was here on Wednesday.  Ryan increased lasix and BP meds.  Patient says avg. BP is 161/106 and was 165/105 this morning.   ?

## 2014-05-01 NOTE — Telephone Encounter (Signed)
-  Increase BiDil to 2 tabs tid with hold parameters for SBP <100. -Please let him know this does not mean the treatment is failing, Heart failure takes time to get the correct medication regimen on board.  -Plan to check bmet in 1 week, add lisinopril 5 mg vs Entresto at that time  -Continue lifestyle changes and monitoring from home

## 2014-05-01 NOTE — Telephone Encounter (Signed)
Spoke w/ pt.  Advised him of Ryan's recommendation.  He verbalizes understanding and will keep his lab appt on Wed.

## 2014-05-04 ENCOUNTER — Telehealth: Payer: Self-pay | Admitting: Physician Assistant

## 2014-05-04 DIAGNOSIS — I5021 Acute systolic (congestive) heart failure: Secondary | ICD-10-CM

## 2014-05-04 MED ORDER — ISOSORB DINITRATE-HYDRALAZINE 20-37.5 MG PO TABS: 2.0000 | ORAL_TABLET | Freq: Three times a day (TID) | ORAL | Status: DC

## 2014-05-04 NOTE — Telephone Encounter (Signed)
lvm 2/1

## 2014-05-04 NOTE — Telephone Encounter (Signed)
Patient requested refill on his Bidil  Medication refilled as requested   Patient also wanted Alycia RossettiRyan to know that he was experiencing swelling in his hands, feet and maybe his face  He does not feel that it is an allergic reaction  He stated he went out for dinner over the weekend and had a "large plate of [place] pasta" He gained about 4 lbs   I instructed patient to follow a low sodium diet and to call asap if symptoms progressed  Informed patient he can pick up [employer] papers when he is here for his lab   Instructed patient to contact EMS if his situation became emergent

## 2014-05-04 NOTE — Telephone Encounter (Signed)
Pt is calling stating he has questions: 1. That we increased med's to 2 tab 3x a day  Going to run out in 3 day Needs refill 2. Swelling in hand and feet and hand. Wanted us to be aware. 3. 145/89 average BP since Friday.   4. Pt dropped of some [employer] forms would like to know since he is coming this wendsday for labs. If he could get them that day.

## 2014-05-05 NOTE — Telephone Encounter (Signed)
Discussed Ryans instructions with patient  Reviewed low sodium diet  Patient verbalized understanding

## 2014-05-05 NOTE — Telephone Encounter (Signed)
1. With his EF he cannot eat like that. Low salt diet.  2. He can increase his Lasix to 80 mg daily for 2 days then back down to 40 mg daily 3. Recheck bmet on 05/07/2014

## 2014-05-06 ENCOUNTER — Other Ambulatory Visit: Payer: PRIVATE HEALTH INSURANCE

## 2014-05-06 ENCOUNTER — Telehealth: Payer: Self-pay | Admitting: Physician Assistant

## 2014-05-06 NOTE — Telephone Encounter (Signed)
[facility]  He stated he will call back if rx needs to be sent somewhere else

## 2014-05-06 NOTE — Telephone Encounter (Signed)
Patients needs Bidil from pharmacy.  [facility] [address] In [place] has on back order and patient doesn't know what to do.  Suggested that [facility] call [facility] and see if another location has in stock while he is waiting for our office to call him back.  Please call patient.

## 2014-05-06 NOTE — Telephone Encounter (Signed)
Jake Levine can your please review and send in the refill. Thanks!

## 2014-05-06 NOTE — Telephone Encounter (Signed)
lvm #2 & #3

## 2014-05-07 ENCOUNTER — Telehealth: Payer: Self-pay | Admitting: *Deleted

## 2014-05-07 ENCOUNTER — Other Ambulatory Visit (INDEPENDENT_AMBULATORY_CARE_PROVIDER_SITE_OTHER): Payer: PRIVATE HEALTH INSURANCE | Admitting: *Deleted

## 2014-05-07 DIAGNOSIS — I5021 Acute systolic (congestive) heart failure: Secondary | ICD-10-CM

## 2014-05-07 NOTE — Telephone Encounter (Signed)
Patient stated that he returned to his dry weight after our previous phone conversation  Then he had an organic pizza last night and gained 3 lbs and felt swollen  He denies sob and swelling was "only a little"  I reviewed low sodium diet with patient  I explained that organic is not an indicator of sodium  I instructed patient to take his meds as prescribed and continue to monitor weight Call in the morning if weight increases or does not trend back toward dry weight  Patient verbalized understanding

## 2014-05-08 LAB — BASIC METABOLIC PANEL
BUN/Creatinine Ratio: 14 (ref 9–20)
BUN: 20 mg/dL (ref 6–24)
CHLORIDE: 106 mmol/L (ref 97–108)
CO2: 24 mmol/L (ref 18–29)
CREATININE: 1.48 mg/dL — AB (ref 0.76–1.27)
Calcium: 8.7 mg/dL (ref 8.7–10.2)
GFR calc Af Amer: 62 mL/min/{1.73_m2} (ref >59)
GFR, EST NON AFRICAN AMERICAN: 54 mL/min/{1.73_m2} — AB (ref >59)
Glucose: 67 mg/dL (ref 65–99)
POTASSIUM: 3.9 mmol/L (ref 3.5–5.2)
Sodium: 145 mmol/L — ABNORMAL HIGH (ref 134–144)

## 2014-05-08 NOTE — Telephone Encounter (Signed)
Reviewed Ryans instructions with patient  Thoroughly reviewed instructions for low sodium diet  Patient verbalized understanding

## 2014-05-08 NOTE — Telephone Encounter (Signed)
This is the 2nd time in 1 week is has not followed a low sodium diet. If he does not follow a low sodium diet he will have a poor outcome, meaning death. We discussed in detail during his OV the mortality rate of CHF so he is aware of this. He has poor renal function, the more we have to titrate his diuretic the greater the likelihood he is to go into renal failure. Have him monitor his weight over the weekend with a low sodium and fluid restriction (less than 2L daily). If weight gets back to baseline, no need to increase diuretic. If weight continues to increase then he may have to increase his diuretic for a couple of days then go back down. He must know though continuing to do this leads to a poor outcome.    ---Overall poor prognosis.

## 2014-05-12 ENCOUNTER — Telehealth: Payer: Self-pay

## 2014-05-12 NOTE — Telephone Encounter (Signed)
Patient wanted to makes sure he is supposed to continue lasix as ordered after this bottle runs out  I confirmed that patient is to continue his lasix  Patient verbalized understanding

## 2014-05-12 NOTE — Telephone Encounter (Signed)
Pt has question regarding Lasix. Please call.

## 2014-05-15 ENCOUNTER — Other Ambulatory Visit: Payer: Self-pay | Admitting: *Deleted

## 2014-05-15 ENCOUNTER — Telehealth: Payer: Self-pay

## 2014-05-15 MED ORDER — FUROSEMIDE 40 MG PO TABS: 40.0000 mg | ORAL_TABLET | Freq: Every day | ORAL | Status: DC

## 2014-05-15 MED ORDER — CARVEDILOL 25 MG PO TABS: 25.0000 mg | ORAL_TABLET | Freq: Two times a day (BID) | ORAL | Status: DC

## 2014-05-15 MED ORDER — POTASSIUM CHLORIDE CRYS ER 20 MEQ PO TBCR: 40.0000 meq | EXTENDED_RELEASE_TABLET | Freq: Every day | ORAL | Status: DC

## 2014-05-15 MED ORDER — SIMVASTATIN 20 MG PO TABS: 20.0000 mg | ORAL_TABLET | Freq: Every day | ORAL | Status: DC

## 2014-05-15 NOTE — Telephone Encounter (Signed)
Informed patient that he can use Jake Levine  Patient verbalized understanding

## 2014-05-15 NOTE — Telephone Encounter (Signed)
Pt would like to know if he can use a salt substitue. Please call.

## 2014-05-18 ENCOUNTER — Other Ambulatory Visit: Payer: Self-pay

## 2014-05-18 ENCOUNTER — Other Ambulatory Visit: Payer: Self-pay | Admitting: *Deleted

## 2014-05-18 MED ORDER — CARVEDILOL 25 MG PO TABS: 25.0000 mg | ORAL_TABLET | Freq: Two times a day (BID) | ORAL | Status: DC

## 2014-05-18 MED ORDER — SIMVASTATIN 20 MG PO TABS: 20.0000 mg | ORAL_TABLET | Freq: Every day | ORAL | Status: DC

## 2014-05-18 MED ORDER — POTASSIUM CHLORIDE CRYS ER 20 MEQ PO TBCR: 40.0000 meq | EXTENDED_RELEASE_TABLET | Freq: Every day | ORAL | Status: DC

## 2014-05-18 MED ORDER — FUROSEMIDE 40 MG PO TABS: 40.0000 mg | ORAL_TABLET | Freq: Every day | ORAL | Status: DC

## 2014-05-18 MED ORDER — FUROSEMIDE 40 MG PO TABS
40.0000 mg | ORAL_TABLET | Freq: Every day | ORAL | Status: DC
Start: 2014-05-18 — End: 2014-05-18

## 2014-05-18 NOTE — Telephone Encounter (Signed)
Refill sent for simvastatin, potassium,lasix and carvedilol to [facility]

## 2014-05-28 ENCOUNTER — Encounter: Payer: Self-pay | Admitting: Physician Assistant

## 2014-05-28 ENCOUNTER — Ambulatory Visit (INDEPENDENT_AMBULATORY_CARE_PROVIDER_SITE_OTHER): Payer: PRIVATE HEALTH INSURANCE | Admitting: Physician Assistant

## 2014-05-28 VITALS — BP 148/98 | HR 60 | Ht 66.0 in | Wt 171.2 lb

## 2014-05-28 DIAGNOSIS — N183 Chronic kidney disease, stage 3 unspecified: Secondary | ICD-10-CM

## 2014-05-28 DIAGNOSIS — I110 Hypertensive heart disease with heart failure: Secondary | ICD-10-CM

## 2014-05-28 DIAGNOSIS — I10 Essential (primary) hypertension: Secondary | ICD-10-CM

## 2014-05-28 DIAGNOSIS — E785 Hyperlipidemia, unspecified: Secondary | ICD-10-CM

## 2014-05-28 DIAGNOSIS — D869 Sarcoidosis, unspecified: Secondary | ICD-10-CM

## 2014-05-28 DIAGNOSIS — I509 Heart failure, unspecified: Secondary | ICD-10-CM

## 2014-05-28 DIAGNOSIS — I5021 Acute systolic (congestive) heart failure: Secondary | ICD-10-CM

## 2014-05-28 DIAGNOSIS — I429 Cardiomyopathy, unspecified: Secondary | ICD-10-CM

## 2014-05-28 MED ORDER — SACUBITRIL-VALSARTAN 24-26 MG PO TABS: 1.0000 | ORAL_TABLET | Freq: Two times a day (BID) | ORAL | Status: DC

## 2014-05-28 NOTE — Patient Instructions (Signed)
Your physician has recommended you make the following change in your medication:  Start Entresto 24/26 mg once daily    Your physician recommends that you have labs today: BMP  Your physician recommends that you schedule a follow-up appointment in:  2-4 weeks

## 2014-05-28 NOTE — Progress Notes (Signed)
Patient Name: Jake Levine, Jake Levine June 28, 1960, MRN [id number]  Date of Encounter: 05/28/2014  Primary Care Provider:  [name], MD Primary Cardiologist:  Dr. Anne Fu, MD  Chief Complaint  Patient presents with  . other    1 month f/u c/o nose stopped up and bleeding. Meds reviewed verbally with pt.    HPI:  54 year old male with history of HTN (untreated for years), "enlarged heart," obstructive sleep apnea, previously on CPAP but reportedly improved with 40 pound weight loss who presents for follow up of his chronic systolic CHF and hypertensive heart disease.   He apparently saw Dr. [name] approximately 10 years prior for management of his hypertension. Per the patient he did not have satisfactory results so he stopped taking all of his medications and did not follow up any longer. He was recently admitted to [facility] 1/15-1/19 with acute systolic CF/dilated cardiomyopathy in the setting of severe hypertensive heart disease. BP was 172/115 upon presentation. At that time he noticed increased cough, DOE, orthopnea, and post nasal drip. He was off all antihypertensives for many years. CXR at [facility] showed enlarged cardiac silhouette and pulmonary vascular congestion as well as right hilar fullness.Therefore CT chest scan was ordered and showed hilar and subcarinal lymphadenopathy with subcentimeter nodules, possibly consistent with sarcoidosis. Echo on 04/18/14 showed an EF of 10-15%, Wall thickness wasincreased in a pattern of mild LVH. There was moderate concentric hypertrophy. Systolic function was normal. Unable to exclude RMA. GR3DD. Moderately dilated LA. Cardiac medications were up titrated and he was diuresed. He underwent cardiac cath on 1/18 that showed widely patent coronary arteries, elevated LVEDP, known severe dilated cardiomyopathy. It was suspected the patient had severe hypertensive heart disease and medical management was recommended.   In hospital follow  up he was feeling well, though it was discovered he was continuing to add salt to his diet. This was discussed with him in detail. His weight was stable at that time. He did call the off twice, once on 2/1 after eating a large plate of pasta noting weight gain and again on 2/4 after eating a large pizza. His Lasix was increased briefly on 2/1 to aid in diuresis. Long phone discussions were had to continue to educate the patient.   He comes in stating physically he feels better. Mentally he continues to grasp with the concept of CHF. He is exercising 6 times daily vigorously and tolerating this well. Blood pressures at home have ranged from the 140s to the mid 160s systolic over 90s. Pulse has been in the mid 60s. Weight at home has been from 158-160. He has changed his diet since I last saw him and now cooks all his meals from scratch except for one per day. He allows for himself to have one "cheat day" per week. He finds himself wanting more sweets now. Taking medications daily without issues. No chest pain, SOB, cough, lower extremity edema, early satiety, orthopnea, abdominal distension. He does note some nasal congestion and epistaxis that improved after stopping Flonase.      Past Medical History  Diagnosis Date  . Hypertension   . Enlarged heart   . Sleep apnea     "lost 75#; no need for mask anymore" (04/18/14)  . Hypertensive heart disease     a. echo 04/2014: EF 10-15%, mild LVH, mod concentrtic hypertrophy,  RWMA cannot be excluded, GR3DD, left atrium mod dilated at 53 mm  . CKD (chronic kidney disease), stage III   .  HLD (hyperlipidemia)   . NICM (nonischemic cardiomyopathy)     a. cath 04/2014: widely patent coronary arteries, elevted LVEDP, known severe dilated CM  :  Past Surgical History  Procedure Laterality Date  . Tumor excision Left <2010    "fatty growth"  . Facial fracture surgery Right ~ 1990  . Vasectomy    . Left heart catheterization with coronary angiogram N/A  04/20/2014    Procedure: LEFT HEART CATHETERIZATION WITH CORONARY ANGIOGRAM;  Surgeon: [name], MD;  [facility];  Service: Cardiovascular;  Laterality: N/A;  :  Social History:  The patient  reports that he has never smoked. He has never used smokeless tobacco. He reports that he drinks about 4.2 oz of alcohol per week. He reports that he does not use illicit drugs.   Family History  Problem Relation Age of Onset  . Colon cancer Father   . Lupus Sister   . Stroke Mother      Allergies:  Allergies  Allergen Reactions  . Other     Blood pressure medications     Home Medications:  Current Outpatient Prescriptions  Medication Sig Dispense Refill  . albuterol (PROVENTIL HFA;VENTOLIN HFA) 108 (90 BASE) MCG/ACT inhaler Inhale 1-2 puffs into the lungs every 6 (six) hours as needed for wheezing or shortness of breath.    Marland Kitchen. aspirin EC 81 MG tablet Take 81 mg by mouth daily.    . carvedilol (COREG) 25 MG tablet Take 1 tablet (25 mg total) by mouth 2 (two) times daily with a meal. 60 tablet 3  . fluticasone (FLONASE) 50 MCG/ACT nasal spray Place 1 spray into both nostrils as needed.     . furosemide (LASIX) 40 MG tablet Take 1 tablet (40 mg total) by mouth daily. 30 tablet 3  . guaifenesin (ROBITUSSIN) 100 MG/5ML syrup Take 200 mg by mouth 3 (three) times daily as needed for cough.    . isosorbide-hydrALAZINE (BIDIL) 20-37.5 MG per tablet Take 2 tablets by mouth 3 (three) times daily. 180 tablet 3  . loratadine (CLARITIN) 10 MG tablet Take 10 mg by mouth daily.    . potassium chloride SA (K-DUR,KLOR-CON) 20 MEQ tablet Take 2 tablets (40 mEq total) by mouth daily. 60 tablet 3  . simvastatin (ZOCOR) 20 MG tablet Take 1 tablet (20 mg total) by mouth daily at 6 PM. 30 tablet 3   No current facility-administered medications for this visit.     Weights: Wt Readings from Last 3 Encounters:  05/28/14 171 lb 4 oz (77.678 kg)  04/29/14 169 lb (76.658 kg)  04/21/14 158 lb  11.7 oz (72 kg)     Review of Systems:  As above.  All other systems reviewed and are otherwise negative except as noted above.  Physical Exam:  Blood pressure 148/98, pulse 60, height 5\' 6"  (1.676 m), weight 171 lb 4 oz (77.678 kg).  General: Pleasant, NAD Psych: Normal affect. Neuro: Alert and oriented X 3. Moves all extremities spontaneously. HEENT: Normal  Neck: Supple without bruits or JVD. Lungs:  Resp regular and unlabored, CTA. Heart: RRR no s3, s4, or murmurs. Abdomen: Soft, non-tender, non-distended, BS + x 4.  Extremities: No clubbing, cyanosis or edema.     Recent Labs: 04/17/2014: ALT 16; B Natriuretic Peptide 1004.0*; Hemoglobin 13.5; Platelets 203; TSH 1.866 04/19/2014: Magnesium 2.0 05/07/2014: BUN 20; Creatinine 1.48*; Potassium 3.9; Sodium 145*    Lipid Panel    Component Value Date/Time   CHOL 221* 04/20/2014 0128  TRIG 44 04/20/2014 0128   HDL 67 04/20/2014 0128   CHOLHDL 3.3 04/20/2014 0128   VLDL 9 04/20/2014 0128   LDLCALC 145* 04/20/2014 0128    Assessment & Plan:  1. Chronic systolic CHF secondary to NICM likely due to hypertensive heart disease:  -EF 10-15%, placing him at increased risk of SCD -Repeat echo 07/2014, if EF still less than 35% will need to have further ICD discussion  -Start Entresto 24/26 mg bid (not currently on ACE or ARB), monitor SCr closely  -Continue Coreg 25 mg bid, Lasix 40 mg daily, BiDil 2 tabs tid  -Could eventually add spironolactone if SCr remains stable  -Plan to get patient into HF clinic ?[place] vs [place] -He reports his diet has improved significantly, kudos -Continue to be active   2. Hilar lymphadenopathy/pulmonary nodules suggestive of sarcoidosis:  -Referral to pulmonology  -He has seen ophthalmology -He will need a cardiac MRI in follow up to rule out cardiac sarcoidosis, though this would be with contrast and given his SCr at 1.48 currently we may need to see if his SCr improves in short term follow  up with changes made today  -Also would like to see what pulmonology has to say regarding possible sarcoid prior to MRI with contrast, patient is agreeable to this  3. Essential HTN  -Improving from initial presentation -Entresto added as above, plan to titrate accordingly   4. CKD stage III (Baseline Cr likely 1.3) - see above:  -Baseline likely 1.3, new baseline possibly around 1.4 given discharge SCr at 1.42, hospital follow up SCr at 1.41 on 1/25, and follow up bmet with SCr at 1.48 -Bmet pending  5. OSA improved with weight loss per patient   6. HLD:  -Continue simvastatin 20 mg  7. Epistaxis: -Currently resolved -Hold Flonase -Saline nasal spray -Follow up with PCP if returns   Dispo: -Follow up 2-4 weeks  Eula Listen, PA-C [facility] [address] [place], [place] [address] [phone] [facility] 05/28/2014, 1:33 PM

## 2014-05-29 ENCOUNTER — Telehealth: Payer: Self-pay | Admitting: *Deleted

## 2014-05-29 DIAGNOSIS — I5021 Acute systolic (congestive) heart failure: Secondary | ICD-10-CM

## 2014-05-29 LAB — BASIC METABOLIC PANEL
BUN/Creatinine Ratio: 11 (ref 9–20)
BUN: 18 mg/dL (ref 6–24)
CALCIUM: 9.3 mg/dL (ref 8.7–10.2)
CO2: 23 mmol/L (ref 18–29)
Chloride: 104 mmol/L (ref 97–108)
Creatinine, Ser: 1.69 mg/dL — ABNORMAL HIGH (ref 0.76–1.27)
GFR calc Af Amer: 53 mL/min/{1.73_m2} — ABNORMAL LOW (ref >59)
GFR, EST NON AFRICAN AMERICAN: 46 mL/min/{1.73_m2} — AB (ref >59)
GLUCOSE: 78 mg/dL (ref 65–99)
Potassium: 4.6 mmol/L (ref 3.5–5.2)
Sodium: 142 mmol/L (ref 134–144)

## 2014-05-29 NOTE — Telephone Encounter (Signed)
Reviewed results with patient.  Patient verbalized understanding  

## 2014-05-29 NOTE — Telephone Encounter (Signed)
-----   Message from Sondra Bargesyan M Dunn, PA-C sent at 05/29/2014 12:43 PM EST ----- Please call the patient. SCr is higher than previous. Please have him decrease his Lasix to 20 mg daily for the next 4 days Recheck bmet following that

## 2014-06-03 ENCOUNTER — Other Ambulatory Visit (INDEPENDENT_AMBULATORY_CARE_PROVIDER_SITE_OTHER): Payer: PRIVATE HEALTH INSURANCE | Admitting: *Deleted

## 2014-06-03 DIAGNOSIS — I5021 Acute systolic (congestive) heart failure: Secondary | ICD-10-CM

## 2014-06-04 LAB — BASIC METABOLIC PANEL
BUN/Creatinine Ratio: 13 (ref 9–20)
BUN: 17 mg/dL (ref 6–24)
CALCIUM: 9.3 mg/dL (ref 8.7–10.2)
CHLORIDE: 107 mmol/L (ref 97–108)
CO2: 26 mmol/L (ref 18–29)
Creatinine, Ser: 1.31 mg/dL — ABNORMAL HIGH (ref 0.76–1.27)
GFR calc Af Amer: 72 mL/min/{1.73_m2} (ref >59)
GFR calc non Af Amer: 63 mL/min/{1.73_m2} (ref >59)
GLUCOSE: 79 mg/dL (ref 65–99)
Potassium: 4.5 mmol/L (ref 3.5–5.2)
Sodium: 147 mmol/L — ABNORMAL HIGH (ref 134–144)

## 2014-06-08 ENCOUNTER — Telehealth: Payer: Self-pay | Admitting: *Deleted

## 2014-06-08 NOTE — Telephone Encounter (Signed)
Spoke with Pharmacy [facility] he never picked up medication when it was sent in. They only hold for 9 days and then they put back. Left message to let patient know that they will go head and refill medication [facility]

## 2014-06-08 NOTE — Telephone Encounter (Signed)
Pt is calling about refill that was suppose to be sent in but it was never sent it.   Intresto- [facility] [address]-was not sure how much he was going to get.  Please call patient so he can go get it when ready.

## 2014-06-15 ENCOUNTER — Other Ambulatory Visit: Payer: Self-pay

## 2014-06-15 MED ORDER — CARVEDILOL 25 MG PO TABS: 25.0000 mg | ORAL_TABLET | Freq: Two times a day (BID) | ORAL | Status: DC

## 2014-06-15 MED ORDER — SIMVASTATIN 20 MG PO TABS: 20.0000 mg | ORAL_TABLET | Freq: Every day | ORAL | Status: DC

## 2014-06-15 NOTE — Telephone Encounter (Signed)
Refill sent for simvastatin and carvedilol.

## 2014-06-17 ENCOUNTER — Other Ambulatory Visit: Payer: Self-pay | Admitting: *Deleted

## 2014-06-17 MED ORDER — CARVEDILOL 25 MG PO TABS: 25.0000 mg | ORAL_TABLET | Freq: Two times a day (BID) | ORAL | Status: DC

## 2014-06-17 MED ORDER — SIMVASTATIN 20 MG PO TABS: 20.0000 mg | ORAL_TABLET | Freq: Every day | ORAL | Status: DC

## 2014-06-25 ENCOUNTER — Encounter: Payer: Self-pay | Admitting: Cardiovascular Disease

## 2014-06-25 ENCOUNTER — Ambulatory Visit (INDEPENDENT_AMBULATORY_CARE_PROVIDER_SITE_OTHER): Payer: PRIVATE HEALTH INSURANCE | Admitting: Cardiovascular Disease

## 2014-06-25 VITALS — BP 130/80 | HR 68 | Ht 66.0 in | Wt 166.0 lb

## 2014-06-25 DIAGNOSIS — N183 Chronic kidney disease, stage 3 unspecified: Secondary | ICD-10-CM

## 2014-06-25 DIAGNOSIS — E785 Hyperlipidemia, unspecified: Secondary | ICD-10-CM | POA: Diagnosis not present

## 2014-06-25 DIAGNOSIS — I429 Cardiomyopathy, unspecified: Secondary | ICD-10-CM | POA: Diagnosis not present

## 2014-06-25 DIAGNOSIS — I110 Hypertensive heart disease with heart failure: Secondary | ICD-10-CM

## 2014-06-25 DIAGNOSIS — D869 Sarcoidosis, unspecified: Secondary | ICD-10-CM

## 2014-06-25 DIAGNOSIS — I509 Heart failure, unspecified: Secondary | ICD-10-CM

## 2014-06-25 MED ORDER — SACUBITRIL-VALSARTAN 97-103 MG PO TABS: 1.0000 | ORAL_TABLET | Freq: Two times a day (BID) | ORAL | Status: DC

## 2014-06-25 NOTE — Progress Notes (Signed)
Patient ID: Jake SimmondsMichael Ludemann, male    DOB: 04/08/1960, 54 y.o.   MRN: [id number]  HPI Comments: 54 year old male with history of HTN (untreated for years), cardiomyopathy, obstructive sleep apnea, previously on CPAP but reportedly improved with 40 pound weight loss who presents for follow up of his chronic systolic CHF and hypertensive heart disease.   In follow-up today, he reports that his blood pressure continues to run high typically 150 up to 160 systolic. He wonders why he is on so many medications. Denies having significant shortness of breath or leg edema. Typically when he has fluid overload, he has leg edema, currently this is well controlled. Overall he feels well with no complaints  Other past medical history Long history of poorly controlled hypertension [facility] with acute systolic CF/dilated cardiomyopathy in the setting of severe hypertensive heart disease. BP was 172/115 upon presentation.  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  Echo on 04/18/14 showed an EF of 10-15%, Wall thickness wasincreased in a pattern of mild LVH. There was moderate concentric hypertrophy.  Unable to exclude RMA. GR3DD. Moderately dilated LA.   He underwent cardiac cath on 1/18 that showed widely patent coronary arteries, elevated LVEDP, known severe dilated cardiomyopathy. It was suspected the patient had severe hypertensive heart disease and medical management was recommended.      Allergies  Allergen Reactions  . Other     Blood pressure medications    Outpatient Encounter Prescriptions as of 06/25/2014  Medication Sig  . albuterol (PROVENTIL HFA;VENTOLIN HFA) 108 (90 BASE) MCG/ACT inhaler Inhale 1-2 puffs into the lungs every 6 (six) hours as needed for wheezing or shortness of breath.  Marland Kitchen. aspirin EC 81 MG tablet Take 81 mg by mouth daily.  . carvedilol (COREG) 25  MG tablet Take 1 tablet (25 mg total) by mouth 2 (two) times daily with a meal.  . fluticasone (FLONASE) 50 MCG/ACT nasal spray Place 1 spray into both nostrils as needed.   . furosemide (LASIX) 40 MG tablet Take 1 tablet (40 mg total) by mouth daily. (Patient taking differently: Take 20 mg by mouth daily. )  . isosorbide-hydrALAZINE (BIDIL) 20-37.5 MG per tablet Take 2 tablets by mouth 3 (three) times daily.  Marland Kitchen. loratadine (CLARITIN) 10 MG tablet Take 10 mg by mouth daily.  . potassium chloride SA (K-DUR,KLOR-CON) 20 MEQ tablet Take 2 tablets (40 mEq total) by mouth daily.  . simvastatin (ZOCOR) 20 MG tablet Take 1 tablet (20 mg total) by mouth daily at 6 PM.  . [DISCONTINUED] sacubitril-valsartan (ENTRESTO) 24-26 MG Take 1 tablet by mouth 2 (two) times daily.  . sacubitril-valsartan (ENTRESTO) 97-103 MG Take 1 tablet by mouth 2 (two) times daily.  . [DISCONTINUED] guaifenesin (ROBITUSSIN) 100 MG/5ML syrup Take 200 mg by mouth 3 (three) times daily as needed for cough.    Past Medical History  Diagnosis Date  . Hypertension   . Enlarged heart   . Sleep apnea     "lost 75#; no need for mask anymore" (04/18/14)  . Hypertensive heart disease     a. echo 04/2014: EF 10-15%, mild LVH, mod concentrtic hypertrophy,  RWMA cannot be excluded, GR3DD, left atrium mod dilated at 53 mm  . CKD (chronic kidney disease), stage III   . HLD (hyperlipidemia)   . NICM (nonischemic cardiomyopathy)     a. cath 04/2014: widely patent coronary arteries, elevted LVEDP, known severe dilated  CM    Past Surgical History  Procedure Laterality Date  . Tumor excision Left <2010    "fatty growth"  . Facial fracture surgery Right ~ 1990  . Vasectomy    . Left heart catheterization with coronary angiogram N/A 04/20/2014    Procedure: LEFT HEART CATHETERIZATION WITH CORONARY ANGIOGRAM;  Surgeon: Micheline Chapman, MD;  Location: [facility] CATH LAB;  Service: Cardiovascular;  Laterality: N/A;    Social History  reports that he  has never smoked. He has never used smokeless tobacco. He reports that he drinks about 4.2 oz of alcohol per week. He reports that he does not use illicit drugs.  Family History family history includes Colon cancer in his father; Lupus in his sister; Stroke in his mother.      Review of Systems  Constitutional: Negative.   HENT: Negative.   Respiratory: Negative.   Cardiovascular: Negative.   Gastrointestinal: Negative.   Musculoskeletal: Negative.   Skin: Negative.   Neurological: Negative.   Hematological: Negative.   Psychiatric/Behavioral: Negative.   All other systems reviewed and are negative.   BP 130/80 mmHg  Pulse 68  Ht  (1.676 m)  Wt 166 lb (75.297 kg)  BMI 26.81 kg/m2  Physical Exam  Constitutional: He is oriented to person, place, and time. He appears well-developed and well-nourished.  HENT:  Head: Normocephalic.  Nose: Nose normal.  Mouth/Throat: Oropharynx is clear and moist.  Eyes: Conjunctivae are normal. Pupils are equal, round, and reactive to light.  Neck: Normal range of motion. Neck supple. No JVD present.  Cardiovascular: Normal rate, regular rhythm, S1 normal, S2 normal, normal heart sounds and intact distal pulses.  Exam reveals no gallop and no friction rub.   No murmur heard. Pulmonary/Chest: Effort normal and breath sounds normal. No respiratory distress. He has no wheezes. He has no rales. He exhibits no tenderness.  Abdominal: Soft. Bowel sounds are normal. He exhibits no distension. There is no tenderness.  Musculoskeletal: Normal range of motion. He exhibits no edema or tenderness.  Lymphadenopathy:    He has no cervical adenopathy.  Neurological: He is alert and oriented to person, place, and time. Coordination normal.  Skin: Skin is warm and dry. No rash noted. No erythema.  Psychiatric: He has a normal mood and affect. His behavior is normal. Judgment and thought content normal.      Assessment and Plan   Nursing note and  vitals reviewed.

## 2014-06-25 NOTE — Assessment & Plan Note (Signed)
Recommended that he stay on his simvastatin 20 mg daily

## 2014-06-25 NOTE — Assessment & Plan Note (Signed)
In follow-up, will ensure that he has follow-up with pulmonary to monitor his sarcoid

## 2014-06-25 NOTE — Patient Instructions (Addendum)
You are doing well.  Please increase the entresto up to the higher dose one pill twice a day Does is 97/103 mg twice a day  Please call us if you have new issues that need to be addressed before your next appt.  Your physician wants you to follow-up in: 3 months.  You will receive a reminder letter in the mail two months in advance. If you don't receive a letter, please call our office to schedule the follow-up appointment.

## 2014-06-25 NOTE — Assessment & Plan Note (Signed)
Blood pressure continues to run high. Overall he is feeling better Recommended he increase the entresto up to 97/103 mg twice a day With close monitoring of his blood pressure.

## 2014-06-25 NOTE — Assessment & Plan Note (Signed)
We'll continue to advance his blood pressure and heart failure regimen. Repeat echocardiogram in several months time

## 2014-06-25 NOTE — Assessment & Plan Note (Signed)
Prior creatinine improved down to 1.3 We'll recheck creatinine in follow-up

## 2014-09-08 ENCOUNTER — Other Ambulatory Visit: Payer: Self-pay

## 2014-09-08 MED ORDER — ISOSORB DINITRATE-HYDRALAZINE 20-37.5 MG PO TABS: 2.0000 | ORAL_TABLET | Freq: Three times a day (TID) | ORAL | Status: DC

## 2014-09-08 NOTE — Telephone Encounter (Signed)
Refill sent for Bidil take 2 tablets by mouth 3 times a day.

## 2014-09-10 ENCOUNTER — Telehealth: Payer: Self-pay | Admitting: *Deleted

## 2014-09-10 NOTE — Telephone Encounter (Signed)
[employer] calling needing some help on a claim that was signed by R.Dunn in order to finish the claim.  They need if pt has been dx cancer, had major organ surgery, or even been dx with heart problems.  If so please fax those office notes to Fax: [phone] attn: Judeth Cornfield  If not please call her she.

## 2014-09-11 ENCOUNTER — Telehealth: Payer: Self-pay | Admitting: *Deleted

## 2014-09-11 NOTE — Telephone Encounter (Signed)
°  1. Which medications need to be refilled? Carvedilol   2. Which pharmacy is medication to be sent to? [facility] [address]   3. Do they need a 30 day or 90 day supply? 90 day   4. Would they like a call back once the medication has been sent to the pharmacy? No

## 2014-09-11 NOTE — Telephone Encounter (Signed)
Dr. Windell Hummingbird last clinic note routed to [facility] attn.

## 2014-09-14 MED ORDER — CARVEDILOL 25 MG PO TABS: 25.0000 mg | ORAL_TABLET | Freq: Two times a day (BID) | ORAL | Status: DC

## 2014-09-14 NOTE — Telephone Encounter (Signed)
Refill sent for carvedilol.  

## 2014-09-25 ENCOUNTER — Ambulatory Visit: Payer: No Typology Code available for payment source | Admitting: Cardiovascular Disease

## 2014-09-28 ENCOUNTER — Other Ambulatory Visit: Payer: Self-pay

## 2014-09-28 MED ORDER — POTASSIUM CHLORIDE CRYS ER 20 MEQ PO TBCR: 40.0000 meq | EXTENDED_RELEASE_TABLET | Freq: Every day | ORAL | Status: DC

## 2014-09-28 NOTE — Telephone Encounter (Signed)
Refill sent for potassium ?

## 2014-09-30 ENCOUNTER — Ambulatory Visit
Admission: RE | Admit: 2014-09-30 | Discharge: 2014-09-30 | Disposition: A | Discharge status: Home / Self Care | Payer: No Typology Code available for payment source | Source: Ambulatory Visit | Attending: Physician Assistant | Admitting: Physician Assistant

## 2014-09-30 ENCOUNTER — Other Ambulatory Visit: Payer: Self-pay

## 2014-09-30 ENCOUNTER — Encounter: Payer: Self-pay | Admitting: Physician Assistant

## 2014-09-30 ENCOUNTER — Ambulatory Visit (INDEPENDENT_AMBULATORY_CARE_PROVIDER_SITE_OTHER): Payer: No Typology Code available for payment source | Admitting: Physician Assistant

## 2014-09-30 ENCOUNTER — Telehealth: Payer: Self-pay | Admitting: Cardiovascular Disease

## 2014-09-30 ENCOUNTER — Other Ambulatory Visit
Admission: RE | Admit: 2014-09-30 | Discharge: 2014-09-30 | Disposition: A | Discharge status: Home / Self Care | Payer: No Typology Code available for payment source | Source: Ambulatory Visit | Attending: Physician Assistant | Admitting: Physician Assistant

## 2014-09-30 VITALS — BP 150/100 | HR 61 | Ht 66.0 in | Wt 166.2 lb

## 2014-09-30 DIAGNOSIS — I10 Essential (primary) hypertension: Secondary | ICD-10-CM | POA: Insufficient documentation

## 2014-09-30 DIAGNOSIS — I517 Cardiomegaly: Secondary | ICD-10-CM | POA: Diagnosis not present

## 2014-09-30 DIAGNOSIS — N183 Chronic kidney disease, stage 3 unspecified: Secondary | ICD-10-CM

## 2014-09-30 DIAGNOSIS — Z91148 Patient's other noncompliance with medication regimen for other reason: Secondary | ICD-10-CM

## 2014-09-30 DIAGNOSIS — I119 Hypertensive heart disease without heart failure: Secondary | ICD-10-CM | POA: Insufficient documentation

## 2014-09-30 DIAGNOSIS — E785 Hyperlipidemia, unspecified: Secondary | ICD-10-CM | POA: Diagnosis not present

## 2014-09-30 DIAGNOSIS — I429 Cardiomyopathy, unspecified: Secondary | ICD-10-CM | POA: Diagnosis not present

## 2014-09-30 DIAGNOSIS — R599 Enlarged lymph nodes, unspecified: Secondary | ICD-10-CM

## 2014-09-30 DIAGNOSIS — I77810 Thoracic aortic ectasia: Secondary | ICD-10-CM | POA: Insufficient documentation

## 2014-09-30 DIAGNOSIS — M546 Pain in thoracic spine: Secondary | ICD-10-CM

## 2014-09-30 DIAGNOSIS — R079 Chest pain, unspecified: Secondary | ICD-10-CM

## 2014-09-30 DIAGNOSIS — Z9114 Patient's other noncompliance with medication regimen: Secondary | ICD-10-CM | POA: Insufficient documentation

## 2014-09-30 DIAGNOSIS — R590 Localized enlarged lymph nodes: Secondary | ICD-10-CM

## 2014-09-30 DIAGNOSIS — R918 Other nonspecific abnormal finding of lung field: Secondary | ICD-10-CM | POA: Diagnosis not present

## 2014-09-30 DIAGNOSIS — I5022 Chronic systolic (congestive) heart failure: Secondary | ICD-10-CM | POA: Insufficient documentation

## 2014-09-30 DIAGNOSIS — Z9119 Patient's noncompliance with other medical treatment and regimen: Secondary | ICD-10-CM

## 2014-09-30 DIAGNOSIS — R0600 Dyspnea, unspecified: Secondary | ICD-10-CM | POA: Diagnosis not present

## 2014-09-30 DIAGNOSIS — I110 Hypertensive heart disease with heart failure: Secondary | ICD-10-CM

## 2014-09-30 DIAGNOSIS — I509 Heart failure, unspecified: Secondary | ICD-10-CM

## 2014-09-30 HISTORY — DX: Thoracic aortic ectasia: I77.810

## 2014-09-30 LAB — TROPONIN I

## 2014-09-30 LAB — CKMB (ARMC ONLY): CK, MB: 4.7 ng/mL (ref 0.5–5.0)

## 2014-09-30 LAB — CK: CK TOTAL: 260 U/L (ref 49–397)

## 2014-09-30 MED ORDER — CLONIDINE HCL 0.1 MG PO TABS: 0.1000 mg | ORAL_TABLET | Freq: Two times a day (BID) | ORAL | Status: DC

## 2014-09-30 MED ORDER — IOHEXOL 350 MG/ML SOLN
100.0000 mL | Freq: Once | INTRAVENOUS | Status: AC | PRN
  Administered 2014-09-30: 100 mL via INTRAVENOUS

## 2014-09-30 NOTE — Progress Notes (Signed)
Cardiology Office Note:  Date of Encounter: 09/30/2014  ID: Jake Levine, DOB [birth year], MRN [id number]  PCP:  [name], MD Primary Cardiologist:  [name], MD  Chief Complaint  Patient presents with  . other    Patient c/o chest pain that radiated up to his shoulders since Monday and had some chest pain this am as well as now. The patient states he did play basketball this weekend which he has not played in years and is not sure if pulled a muscle. Meds reviewed by the patient verbally.     HPI:  54 year old male with history of chronic systolic CHF secondary to NICM, by cardiac cath on 04/20/2014, likely due to hypertensive heart disease, long history of poorly controlled HTN (untreated for years), "enlarged heart," obstructive sleep apnea, previously on CPAP but reportedly improved with 40 pound weight loss, CKD stage III (Baseline Cr likely 1.3), hilar lymphadenopathy/pulmonary nodules suggestive of sarcoidosis, and HLD who presents with complaints of current chest pain.   He apparently saw Dr. [name] approximately 10 years prior for management of his hypertension. Per the patient he did not have satisfactory results in a timely fashion so he stopped taking all of his medications and did not follow up any longer. He was admitted to [facility] 1/15-1/19 with acute systolic CF/dilated cardiomyopathy in the setting of severe hypertensive heart disease. BP was 172/115 upon presentation. At that time he noticed increased cough, DOE, orthopnea, and post nasal drip. He was off all antihypertensives for many years. CXR at [facility] showed enlarged cardiac silhouette and pulmonary vascular congestion as well as right hilar fullness.Therefore CT chest scan was ordered and showed hilar and subcarinal lymphadenopathy with subcentimeter nodules, possibly consistent with sarcoidosis. Echo on 04/18/14 showed an EF of 10-15%, Wall thickness wasincreased in a pattern of mild LVH. There  was moderate concentric hypertrophy. Systolic function was normal. Unable to exclude RMA. GR3DD. Moderately dilated LA. Cardiac medications were up titrated and he was diuresed. He underwent cardiac cath on 1/18 that showed widely patent coronary arteries, elevated LVEDP, known severe dilated cardiomyopathy. It was suspected the patient had severe hypertensive heart disease and medical management was recommended.   In hospital follow up he was feeling well, though it was discovered he was continuing to add salt to his diet. This was discussed with him in detail. His weight was stable at that time. He did call the off twice, once on 2/1 after eating a large plate of pasta noting weight gain and again on 2/4 after eating a large pizza. His Lasix was increased briefly on 2/1 to aid in diuresis. Long phone discussions were had to continue to educate the patient. He continued to exercise regularly upt o 6 times weekly with vigorously activity, tolerating this well. Home BP's ranged in the 140's to 160's systolic. He improved his diet and allowed for one "cheat day" per week. He continued to grasp with the idea of heart failure and has wondered why he is on "so many medications." Weights at home has been from 158-160. He was started on Entresto in February 2016 and has been tolerating this well.   He has noted intermittent chest pain that radiate to his shoulders since 6/27 that is currently improved from baseline. Pain has not limited him from being bale to participate in his martial arts training and self defense class x 2 this week, on Monday 6/27 and earlier this morning prior to coming to this appointment. He also  recently just started playing basketball again on 6/25 x 2 games of 21. Reports he had not played in years. No associated SOB, diaphoresis, nausea, vomiting, palpitations, presyncope, or syncope. No edema or cough. BP has been in the 160's/90's at home. He takes his medications 95% of the time. He  called our office not knowing what to do. Stat troponin, CK-MB, and total CK all negative this morning.      Past Medical History  Diagnosis Date  . Hypertension   . Cardiomegaly   . Sleep apnea     "lost 75#; no need for mask anymore" (04/18/14)  . Hypertensive heart disease   . CKD (chronic kidney disease), stage III     a. baseline SCr 1.3  . HLD (hyperlipidemia)   . NICM (nonischemic cardiomyopathy)     a. cath 04/2014: widely patent coronary arteries, elevted LVEDP, known severe dilated CM  . H/O medication noncompliance   . Chronic systolic CHF (congestive heart failure)     a. echo 04/2014: EF 10-15%, mild LVH, mod concentrtic hypertrophy,  RWMA cannot be excluded, GR3DD, left atrium mod dilated at 53 mm  . Hilar lymphadenopathy     a. suggestive of sarcoidosis  :  Past Surgical History  Procedure Laterality Date  . Tumor excision Left <2010    "fatty growth"  . Facial fracture surgery Right ~ 1990  . Vasectomy    . Left heart catheterization with coronary angiogram N/A 04/20/2014    Procedure: LEFT HEART CATHETERIZATION WITH CORONARY ANGIOGRAM;  Surgeon: [name], MD;  Location: [facility] CATH LAB;  Service: Cardiovascular;  Laterality: N/A;  :  Social History:  The patient  reports that he has never smoked. He has never used smokeless tobacco. He reports that he drinks about 4.2 oz of alcohol per week. He reports that he does not use illicit drugs.   Family History  Problem Relation Age of Onset  . Colon cancer Father   . Lupus Sister   . Stroke Mother      Allergies:  Allergies  Allergen Reactions  . Other     Blood pressure medications     Home Medications:  Current Outpatient Prescriptions  Medication Sig Dispense Refill  . albuterol (PROVENTIL HFA;VENTOLIN HFA) 108 (90 BASE) MCG/ACT inhaler Inhale 1-2 puffs into the lungs every 6 (six) hours as needed for wheezing or shortness of breath.    Marland Kitchen. aspirin EC 81 MG tablet Take 81 mg by mouth daily.    .  carvedilol (COREG) 25 MG tablet Take 1 tablet (25 mg total) by mouth 2 (two) times daily with a meal. 90 tablet 3  . fluticasone (FLONASE) 50 MCG/ACT nasal spray Place 1 spray into both nostrils as needed.     . furosemide (LASIX) 20 MG tablet Take 20 mg by mouth daily.    . isosorbide-hydrALAZINE (BIDIL) 20-37.5 MG per tablet Take 2 tablets by mouth 3 (three) times daily. 180 tablet 3  . loratadine (CLARITIN) 10 MG tablet Take 10 mg by mouth daily.    . potassium chloride SA (K-DUR,KLOR-CON) 20 MEQ tablet Take 2 tablets (40 mEq total) by mouth daily. 60 tablet 3  . sacubitril-valsartan (ENTRESTO) 97-103 MG Take 1 tablet by mouth 2 (two) times daily. 180 tablet 3  . simvastatin (ZOCOR) 20 MG tablet Take 1 tablet (20 mg total) by mouth daily at 6 PM. 90 tablet 3  . cloNIDine (CATAPRES) 0.1 MG tablet Take 1 tablet (0.1 mg total) by mouth 2 (two)  times daily. 60 tablet 11   No current facility-administered medications for this visit.     Review of Systems:  Review of Systems  Constitutional: Negative for fever, chills, weight loss, malaise/fatigue and diaphoresis.  HENT: Negative for congestion.   Eyes: Negative for blurred vision, pain, discharge and redness.  Respiratory: Negative for cough, hemoptysis, sputum production, shortness of breath and wheezing.   Cardiovascular: Positive for chest pain. Negative for palpitations, orthopnea, claudication, leg swelling and PND.  Gastrointestinal: Negative for heartburn, nausea, vomiting and abdominal pain.  Musculoskeletal: Positive for back pain. Negative for myalgias, joint pain, falls and neck pain.  Skin: Negative for rash.  Neurological: Negative for dizziness, sensory change, speech change, focal weakness, weakness and headaches.  Endo/Heme/Allergies: Does not bruise/bleed easily.  Psychiatric/Behavioral: The patient is not nervous/anxious.   All other systems reviewed and are negative.    Physical Exam:  Blood pressure 150/100, pulse  61, height 5\' 6"  (1.676 m), weight 166 lb 4 oz (75.411 kg). BMI: Body mass index is 26.85 kg/(m^2). General: Pleasant, NAD. Psych: Normal affect. Responds to questions with normal affect.  Neuro: Alert and oriented X 3. Moves all extremities spontaneously. HEENT: Normocephalic, atraumatic. EOM intact. Sclera anicteric.  Neck: Trachea midline. Supple without bruits or JVD. Lungs:  Respirations regular and unlabored. CTA bilaterally without wheezing, crackles, or rhonchi.  Heart: RRR, normal s3, s4. No murmurs, rubs, or gallops.  Abdomen: Soft, non-tender, non-distended, BS + x 4.  Extremities: No clubbing, cyanosis or edema. DP/PT/Radials 2+ and equal bilaterally.   Accessory Clinical Findings:  EKG: NSR, 61 bpm, first degree AV block, possible pulmonary disease, diffuse nonspecific st/t changes   Recent Labs: 04/17/2014: ALT 16; B Natriuretic Peptide 1004.0*; Hemoglobin 13.5; Platelets 203; TSH 1.866 04/19/2014: Magnesium 2.0 06/03/2014: BUN 17; Creatinine, Ser 1.31*; Potassium 4.5; Sodium 147*  04/20/2014: Cholesterol 221*; HDL 67; LDL Cholesterol 145*; Total CHOL/HDL Ratio 3.3; Triglycerides 44; VLDL 9  CrCl cannot be calculated (Patient has no serum creatinine result on file.).  Weights: Wt Readings from Last 3 Encounters:  09/30/14 166 lb 4 oz (75.411 kg)  06/25/14 166 lb (75.297 kg)  05/28/14 171 lb 4 oz (77.678 kg)    Other studies Reviewed: Additional studies/ records that were reviewed today include: prior notes.  Assessment & Plan:  1. Chest pain:  -Recent cardiac cath 04/20/2014 the showed widely patent coronary arteries  -He has a history of recurrent intermittent chest pains  -Patient was able to perform his self defense class x 2 this week (Monday and Wednesday - earlier this morning - without issues) -Stat troponin, CK-MB,and CK all negative for acute cardiac and muscular injury -Schedule stat CTA aorta to rule out aortic dissection given his symptoms and history of  longstanding, poorly controlled HTN  -If CTA is negative will treat for MSK injury and better BP control -If CTA shows acute pathology will manage pending results   2. Chronic systolic CHF secondary to NICM likely due to hypertensive heart disease:  -He does not appear to be volume overloaded on exam today, without rales on exam and no lower extremity edema -Continue Entresto 97/103 bid, Coreg 0.1 bid, Lasix 20 mg daily, and BiDil 20/37.5 two tabs tid -Get back on heart healthy diet limiting po fluid intake and salt consumption, no sodas  -Continue active lifestyle   3. Essential HTN:  -Continued poor control of his blood pressure  -Add clonidine 0.1 mg bid -Continue Coreg 25 mg bid, Lasix 20 mg daily, BiDil 20/37.5 two  tabs tid, Entresto 97/103 bid -Close follow up until goal BP is achieved of less than 120/80   4. CKD stage III:  -Check bmet prior to above CTA aorta   5. Hilar lymphadenopathy/pulmonary nodules suggestive of sarcoidosis:  -Continue to follow up with pulmonology   6. OSA improved with weight loss per patient   7. HLD:  -Continue simvastatin 20 mg    Dispo: -Follow up with [name], MD in 1 month  Current medicines are reviewed at length with the patient today.  The patient did not have any concerns regarding medicines.   Eula Listen, PA-C [facility] [address] [place], [place] [address] [phone] [facility] 09/30/2014, 12:58 PM

## 2014-09-30 NOTE — Telephone Encounter (Signed)
Spoke with the patient states having chest pain that did radiate to his back up to his shoulders since Monday which has been continuous since then; had chest pain this am but seems to have eased up. Patient denies shortness of breath or any other symptoms associated with the chest pain. Please advise what to do. Per Eula Listenyan Dunn, PA said to have patient come to the office at 9:30 or 10:00 for evaluation with Troponin and CKMB stat prior to the appointment; the patient states can't come until 11:00 because he is working and can't just leave. I instructed the patient if he feels that should go to the ER to make sure he has someone take him or call 911. The patient will come at 11:00 to see Eduard Closyan Dunn,PA but he did say if he doesn't think he can wait, he will go to the ER.

## 2014-09-30 NOTE — Patient Instructions (Addendum)
Medication Instructions:  START Clonidine 0.1 twice daily  Labwork: None   Testing/Procedures: Non-Cardiac CT Angiography (CTA), is a special type of CT scan that uses a computer to produce multi-dimensional views of major blood vessels throughout the body. In CT angiography, a contrast material is injected through an IV to help visualize the blood vessels  Follow-Up: Your physician recommends that you schedule a follow-up appointment in: 1 month   Any Other Special Instructions Will Be Listed Below (If Applicable).

## 2014-09-30 NOTE — Telephone Encounter (Signed)
Pt c/o of Chest Pain: STAT if CP now or developed within 24 hours  1. Are you having CP right now? Yes, not bad   2. Are you experiencing any other symptoms (ex. SOB, nausea, vomiting, sweating)? Heartburn took tums and went away last night   3. How long have you been experiencing CP? Monday   4. Is your CP continuous or coming and going?   Continuous Monday - Tuesday night at bed time feels better this morning   5. Have you taken Nitroglycerin? No ?  Feels like muscle ache heartburn towards left shoulder radiates to the back of the shoulder blade dull to medium pain

## 2014-10-06 ENCOUNTER — Other Ambulatory Visit: Payer: Self-pay

## 2014-10-06 MED ORDER — POTASSIUM CHLORIDE CRYS ER 20 MEQ PO TBCR: 40.0000 meq | EXTENDED_RELEASE_TABLET | Freq: Every day | ORAL | Status: DC

## 2014-10-12 ENCOUNTER — Ambulatory Visit: Payer: No Typology Code available for payment source | Admitting: Cardiovascular Disease

## 2014-10-30 ENCOUNTER — Ambulatory Visit: Payer: No Typology Code available for payment source | Admitting: Cardiovascular Disease

## 2014-10-30 ENCOUNTER — Encounter: Payer: Self-pay | Admitting: *Deleted

## 2014-11-10 ENCOUNTER — Other Ambulatory Visit: Payer: Self-pay | Admitting: *Deleted

## 2014-11-10 MED ORDER — SACUBITRIL-VALSARTAN 97-103 MG PO TABS: 1.0000 | ORAL_TABLET | Freq: Two times a day (BID) | ORAL | Status: DC

## 2014-11-11 ENCOUNTER — Other Ambulatory Visit: Payer: Self-pay | Admitting: *Deleted

## 2014-11-11 MED ORDER — SACUBITRIL-VALSARTAN 97-103 MG PO TABS: 1.0000 | ORAL_TABLET | Freq: Two times a day (BID) | ORAL | Status: DC

## 2014-11-18 ENCOUNTER — Other Ambulatory Visit: Payer: Self-pay

## 2014-11-18 MED ORDER — CLONIDINE HCL 0.1 MG PO TABS: 0.1000 mg | ORAL_TABLET | Freq: Two times a day (BID) | ORAL | Status: DC

## 2014-11-18 NOTE — Telephone Encounter (Signed)
Refill sent for clonidine 0.1 mg  

## 2014-11-19 ENCOUNTER — Other Ambulatory Visit: Payer: Self-pay | Admitting: *Deleted

## 2014-11-19 MED ORDER — CLONIDINE HCL 0.1 MG PO TABS: 0.1000 mg | ORAL_TABLET | Freq: Two times a day (BID) | ORAL | Status: DC

## 2014-12-21 ENCOUNTER — Other Ambulatory Visit: Payer: Self-pay | Admitting: Cardiovascular Disease

## 2015-01-04 ENCOUNTER — Other Ambulatory Visit: Payer: Self-pay

## 2015-01-04 ENCOUNTER — Other Ambulatory Visit: Payer: Self-pay | Admitting: *Deleted

## 2015-01-04 MED ORDER — SIMVASTATIN 20 MG PO TABS: 20.0000 mg | ORAL_TABLET | Freq: Every day | ORAL | Status: DC

## 2015-01-05 ENCOUNTER — Other Ambulatory Visit: Payer: Self-pay | Admitting: *Deleted

## 2015-01-05 MED ORDER — SIMVASTATIN 20 MG PO TABS: 20.0000 mg | ORAL_TABLET | Freq: Every day | ORAL | Status: DC

## 2015-01-06 ENCOUNTER — Ambulatory Visit (INDEPENDENT_AMBULATORY_CARE_PROVIDER_SITE_OTHER): Payer: No Typology Code available for payment source | Admitting: Cardiovascular Disease

## 2015-01-06 ENCOUNTER — Encounter: Payer: Self-pay | Admitting: Cardiovascular Disease

## 2015-01-06 VITALS — BP 150/100 | HR 64 | Ht 66.0 in | Wt 167.5 lb

## 2015-01-06 DIAGNOSIS — I110 Hypertensive heart disease with heart failure: Secondary | ICD-10-CM

## 2015-01-06 DIAGNOSIS — I5022 Chronic systolic (congestive) heart failure: Secondary | ICD-10-CM

## 2015-01-06 DIAGNOSIS — I10 Essential (primary) hypertension: Secondary | ICD-10-CM | POA: Diagnosis not present

## 2015-01-06 DIAGNOSIS — N183 Chronic kidney disease, stage 3 unspecified: Secondary | ICD-10-CM

## 2015-01-06 DIAGNOSIS — E785 Hyperlipidemia, unspecified: Secondary | ICD-10-CM

## 2015-01-06 DIAGNOSIS — R0600 Dyspnea, unspecified: Secondary | ICD-10-CM

## 2015-01-06 MED ORDER — CLONIDINE HCL 0.2 MG PO TABS: 0.2000 mg | ORAL_TABLET | Freq: Two times a day (BID) | ORAL | Status: DC

## 2015-01-06 MED ORDER — HYDRALAZINE HCL 50 MG PO TABS: 50.0000 mg | ORAL_TABLET | Freq: Four times a day (QID) | ORAL | Status: DC | PRN

## 2015-01-06 MED ORDER — SIMVASTATIN 20 MG PO TABS: 20.0000 mg | ORAL_TABLET | Freq: Every day | ORAL | Status: DC

## 2015-01-06 NOTE — Assessment & Plan Note (Signed)
Denies shortness of breath. Has been exercising at the gym. Encouraged medication compliance for his blood pressure

## 2015-01-06 NOTE — Assessment & Plan Note (Signed)
He appears relatively euvolemic on today's visit. We did suggest taking extra Lasix for periods of severe hypertension and for any concern of leg edema or abdominal bloating, shortness of breath

## 2015-01-06 NOTE — Assessment & Plan Note (Signed)
We will check a basic metabolic panel today. Tolerating entresto

## 2015-01-06 NOTE — Assessment & Plan Note (Signed)
He continues to have poor control hypertension despite compliance with his medications. We have recommended he increase clonidine up to 0.2 mg twice a day. We have also given him hydralazine 50 mg to take 4 times a day when necessary for systolic pressure more than 160. Recommended he also check his blood pressure at other times of the day in addition to the morning when he first wakes up

## 2015-01-06 NOTE — Assessment & Plan Note (Signed)
Encouraged him to stay on his simvastatin. He has been without his medication recently We'll hold off on rechecking his lab work

## 2015-01-06 NOTE — Progress Notes (Signed)
Patient ID: Jake Levine, male    DOB: June 16, 1960, 54 y.o.   MRN: [id number]  HPI Comments: 54 year old male with history of HTN (untreated for years), nonischemic cardiomyopathy, obstructive sleep apnea, previously on CPAP but reportedly improved with 70 pound weight loss who presents for follow up of his chronic systolic CHF and hypertensive heart disease.   In follow-up today, he reports that his blood pressure continues to run high typically 150 up to 160 systolic. Sometimes up to 190 systolic Takes his blood pressure every morning He is exercising every morning at the gym. Denies any significant shortness of breath with exertion Denies having significant  leg edema or abdominal bloating. Typically when he has fluid overload, he has leg edema, currently this is well controlled. No significant weight change Overall he feels well with no complaints  No EKG on today's visit  Other past medical history Long history of poorly controlled hypertension  admitted to [facility] 1/15-1/19/16 with acute systolic CF/dilated cardiomyopathy in the setting of severe hypertensive heart disease. BP was 172/115 upon presentation.  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  Echo on 04/18/14 showed an EF of 10-15%, Wall thickness wasincreased in a pattern of mild LVH. There was moderate concentric hypertrophy.  Unable to exclude RMA. GR3DD. Moderately dilated LA.   He underwent cardiac cath on 1/18 that showed widely patent coronary arteries, elevated LVEDP, known severe dilated cardiomyopathy. It was suspected the patient had severe hypertensive heart disease and medical management was recommended.      Allergies  Allergen Reactions  . Other     Blood pressure medications    Outpatient Encounter Prescriptions as of 01/06/2015  Medication Sig  . albuterol (PROVENTIL HFA;VENTOLIN HFA) 108 (90 BASE) MCG/ACT  inhaler Inhale 1-2 puffs into the lungs every 6 (six) hours as needed for wheezing or shortness of breath.  Marland Kitchen aspirin EC 81 MG tablet Take 81 mg by mouth daily.  Marland Kitchen BIDIL 20-37.5 MG per tablet TAKE 2 TABLETS BY MOUTH 3 (THREE) TIMES DAILY.  . carvedilol (COREG) 25 MG tablet Take 1 tablet (25 mg total) by mouth 2 (two) times daily with a meal.  . cloNIDine (CATAPRES) 0.2 MG tablet Take 1 tablet (0.2 mg total) by mouth 2 (two) times daily.  . fluticasone (FLONASE) 50 MCG/ACT nasal spray Place 1 spray into both nostrils as needed.   . furosemide (LASIX) 20 MG tablet Take 20 mg by mouth daily.  Marland Kitchen loratadine (CLARITIN) 10 MG tablet Take 10 mg by mouth daily.  . potassium chloride SA (K-DUR,KLOR-CON) 20 MEQ tablet Take 2 tablets (40 mEq total) by mouth daily.  . sacubitril-valsartan (ENTRESTO) 97-103 MG Take 1 tablet by mouth 2 (two) times daily.  . simvastatin (ZOCOR) 20 MG tablet Take 1 tablet (20 mg total) by mouth daily at 6 PM.  . [DISCONTINUED] cloNIDine (CATAPRES) 0.1 MG tablet Take 1 tablet (0.1 mg total) by mouth 2 (two) times daily.  . hydrALAZINE (APRESOLINE) 50 MG tablet Take 1 tablet (50 mg total) by mouth 4 (four) times daily as needed.   No facility-administered encounter medications on file as of 01/06/2015.    Past Medical History  Diagnosis Date  . Hypertension   . Cardiomegaly   . Sleep apnea     "lost 75#; no need for mask anymore" (04/18/14)  . Hypertensive heart disease   . CKD (chronic kidney disease), stage III     a.  baseline SCr 1.3  . HLD (hyperlipidemia)   . NICM (nonischemic cardiomyopathy) (HCC)     a. cath 04/2014: widely patent coronary arteries, elevted LVEDP, known severe dilated CM  . H/O medication noncompliance   . Chronic systolic CHF (congestive heart failure) (HCC)     a. echo 04/2014: EF 10-15%, mild LVH, mod concentrtic hypertrophy,  RWMA cannot be excluded, GR3DD, left atrium mod dilated at 53 mm  . Hilar lymphadenopathy     a. suggestive of  sarcoidosis  . Ascending aorta dilatation (HCC)     a. CTA 09/30/2014: prominent ascending ascending aorta measuring 3.9 cm, recommended annual imaging follow up by CTA or MRA    Past Surgical History  Procedure Laterality Date  . Tumor excision Left <2010    "fatty growth"  . Facial fracture surgery Right ~ 1990  . Vasectomy    . Left heart catheterization with coronary angiogram N/A 04/20/2014    Procedure: LEFT HEART CATHETERIZATION WITH CORONARY ANGIOGRAM;  Surgeon: Micheline Chapman, MD;  Location: [facility] CATH LAB;  Service: Cardiovascular;  Laterality: N/A;    Social History  reports that he has never smoked. He has never used smokeless tobacco. He reports that he drinks about 4.2 oz of alcohol per week. He reports that he does not use illicit drugs.  Family History family history includes Colon cancer in his father; Lupus in his sister; Stroke in his mother.      Review of Systems  Constitutional: Negative.   HENT: Negative.   Respiratory: Negative.   Cardiovascular: Negative.   Gastrointestinal: Negative.   Musculoskeletal: Negative.   Skin: Negative.   Neurological: Negative.   Hematological: Negative.   Psychiatric/Behavioral: Negative.   All other systems reviewed and are negative.   BP 150/100 mmHg  Pulse 64  Ht  (1.676 m)  Wt 167 lb 8 oz (75.978 kg)  BMI 27.05 kg/m2  Physical Exam  Constitutional: He is oriented to person, place, and time. He appears well-developed and well-nourished.  HENT:  Head: Normocephalic.  Nose: Nose normal.  Mouth/Throat: Oropharynx is clear and moist.  Eyes: Conjunctivae are normal. Pupils are equal, round, and reactive to light.  Neck: Normal range of motion. Neck supple. No JVD present.  Cardiovascular: Normal rate, regular rhythm, S1 normal, S2 normal, normal heart sounds and intact distal pulses.  Exam reveals no gallop and no friction rub.   No murmur heard. Pulmonary/Chest: Effort normal and breath sounds normal. No  respiratory distress. He has no wheezes. He has no rales. He exhibits no tenderness.  Abdominal: Soft. Bowel sounds are normal. He exhibits no distension. There is no tenderness.  Musculoskeletal: Normal range of motion. He exhibits no edema or tenderness.  Lymphadenopathy:    He has no cervical adenopathy.  Neurological: He is alert and oriented to person, place, and time. Coordination normal.  Skin: Skin is warm and dry. No rash noted. No erythema.  Psychiatric: He has a normal mood and affect. His behavior is normal. Judgment and thought content normal.      Assessment and Plan   Nursing note and vitals reviewed.

## 2015-01-06 NOTE — Patient Instructions (Addendum)
You are doing well.  Please increase the clonidine up to 0.2 mg twice a day for high blood pressure Use the hydralazine and furosemide as needed for pressure >160  We will check labs today, BMP  Please call us if you have new issues that need to be addressed before your next appt.  Your physician wants you to follow-up in: 6 months.  You will receive a reminder letter in the mail two months in advance. If you don't receive a letter, please call our office to schedule the follow-up appointment.

## 2015-01-07 LAB — BASIC METABOLIC PANEL
BUN/Creatinine Ratio: 13 (ref 9–20)
BUN: 17 mg/dL (ref 6–24)
CHLORIDE: 105 mmol/L (ref 97–108)
CO2: 24 mmol/L (ref 18–29)
Calcium: 9.2 mg/dL (ref 8.7–10.2)
Creatinine, Ser: 1.33 mg/dL — ABNORMAL HIGH (ref 0.76–1.27)
GFR calc Af Amer: 71 mL/min/{1.73_m2} (ref >59)
GFR calc non Af Amer: 61 mL/min/{1.73_m2} (ref >59)
GLUCOSE: 78 mg/dL (ref 65–99)
Potassium: 4.7 mmol/L (ref 3.5–5.2)
Sodium: 144 mmol/L (ref 134–144)

## 2015-01-25 ENCOUNTER — Other Ambulatory Visit: Payer: Self-pay

## 2015-01-25 MED ORDER — FUROSEMIDE 20 MG PO TABS
20.0000 mg | ORAL_TABLET | Freq: Every day | ORAL | Status: DC
Start: 2015-01-25 — End: 2015-10-22

## 2015-02-09 ENCOUNTER — Other Ambulatory Visit: Payer: Self-pay | Admitting: Cardiovascular Disease

## 2015-02-09 MED ORDER — POTASSIUM CHLORIDE CRYS ER 20 MEQ PO TBCR: 40.0000 meq | EXTENDED_RELEASE_TABLET | Freq: Every day | ORAL | Status: DC

## 2015-03-18 ENCOUNTER — Encounter: Payer: Self-pay | Admitting: Nurse Practitioner

## 2015-03-18 ENCOUNTER — Ambulatory Visit: Payer: No Typology Code available for payment source | Admitting: Nurse Practitioner

## 2015-03-18 ENCOUNTER — Ambulatory Visit (INDEPENDENT_AMBULATORY_CARE_PROVIDER_SITE_OTHER): Payer: BLUE CROSS/BLUE SHIELD | Admitting: Nurse Practitioner

## 2015-03-18 VITALS — BP 136/84 | HR 72 | Temp 98.0°F | Ht 66.0 in | Wt 171.4 lb

## 2015-03-18 DIAGNOSIS — Z7189 Other specified counseling: Secondary | ICD-10-CM | POA: Diagnosis not present

## 2015-03-18 DIAGNOSIS — J018 Other acute sinusitis: Secondary | ICD-10-CM | POA: Diagnosis not present

## 2015-03-18 DIAGNOSIS — Z7689 Persons encountering health services in other specified circumstances: Secondary | ICD-10-CM

## 2015-03-18 DIAGNOSIS — N521 Erectile dysfunction due to diseases classified elsewhere: Secondary | ICD-10-CM | POA: Diagnosis not present

## 2015-03-18 NOTE — Progress Notes (Signed)
Patient ID: Jake Levine, male    DOB: 1961-02-12  Age: 54 y.o. MRN: [id number]  CC: Establish Care   HPI Silvia Markuson presents for establishing care and CC of URI symptoms and ED.  1) New pt info:   Immunizations- Declines flu  PSA- pulled samples from prostate, 2015   Eye Exam- 2015 feb   HCV- Possible exposure, unsure of history, was told in the [employer] with no further work up  2) Chronic Problems-  Exercise 5-6 x a weeks martial arts   3) Acute Problems- Coughing, congestion, scratchy throat- kicked in this morning, gargled with salt water, ceiling fan at night- dried out  Claritin- not taken recently  Sick contacts- wife  Weighs daily- 3-4 lbs gradually adding lbs onrecently    ED- BP medications before have been somewhat helpful    First had symptoms for 5 years on and off with the blood pressure issues, trouble getting and maintaining and erection   History Alexzander has a past medical history of Hypertension; Cardiomegaly; Sleep apnea; Hypertensive heart disease; CKD (chronic kidney disease), stage III; HLD (hyperlipidemia); NICM (nonischemic cardiomyopathy) (HCC); H/O medication noncompliance; Chronic systolic CHF (congestive heart failure) (HCC); Hilar lymphadenopathy; and Ascending aorta dilatation (HCC).   He has past surgical history that includes Tumor excision (Left, <2010); Facial fracture surgery (Right, ~ 1990); Vasectomy; and left heart catheterization with coronary angiogram (N/A, 04/20/2014).   His family history includes Alcohol abuse in his maternal grandfather, maternal uncle, and paternal grandmother; Cancer in his father, maternal grandfather, and maternal uncle; Colon cancer in his father; Diabetes in his father; Lupus in his sister; Stroke in his mother.He reports that he has never smoked. He has never used smokeless tobacco. He reports that he drinks about 1.2 oz of alcohol per week. He reports that he does not use illicit drugs.  Outpatient  Prescriptions Prior to Visit  Medication Sig Dispense Refill  . albuterol (PROVENTIL HFA;VENTOLIN HFA) 108 (90 BASE) MCG/ACT inhaler Inhale 1-2 puffs into the lungs every 6 (six) hours as needed for wheezing or shortness of breath.    Marland Kitchen aspirin EC 81 MG tablet Take 81 mg by mouth daily.    Marland Kitchen BIDIL 20-37.5 MG per tablet TAKE 2 TABLETS BY MOUTH 3 (THREE) TIMES DAILY. 180 tablet 3  . carvedilol (COREG) 25 MG tablet Take 1 tablet (25 mg total) by mouth 2 (two) times daily with a meal. 90 tablet 3  . cloNIDine (CATAPRES) 0.2 MG tablet Take 1 tablet (0.2 mg total) by mouth 2 (two) times daily. 180 tablet 3  . furosemide (LASIX) 20 MG tablet Take 1 tablet (20 mg total) by mouth daily. 30 tablet 6  . hydrALAZINE (APRESOLINE) 50 MG tablet Take 1 tablet (50 mg total) by mouth 4 (four) times daily as needed. 120 tablet 6  . loratadine (CLARITIN) 10 MG tablet Take 10 mg by mouth daily.    . potassium chloride SA (K-DUR,KLOR-CON) 20 MEQ tablet Take 2 tablets (40 mEq total) by mouth daily. 60 tablet 11  . sacubitril-valsartan (ENTRESTO) 97-103 MG Take 1 tablet by mouth 2 (two) times daily. 180 tablet 3  . simvastatin (ZOCOR) 20 MG tablet Take 1 tablet (20 mg total) by mouth daily at 6 PM. 90 tablet 3  . fluticasone (FLONASE) 50 MCG/ACT nasal spray Place 1 spray into both nostrils as needed. Reported on 03/18/2015     No facility-administered medications prior to visit.    ROS Review of Systems  HENT: Positive for congestion, postnasal  drip, rhinorrhea, sinus pressure, sneezing and sore throat. Negative for ear discharge and ear pain.   Eyes: Positive for discharge and itching. Negative for visual disturbance.  Respiratory: Positive for cough. Negative for chest tightness, shortness of breath and wheezing.   Cardiovascular: Negative for chest pain, palpitations and leg swelling.  Gastrointestinal: Negative for nausea, vomiting and diarrhea.  Skin: Negative for rash.    Objective:  BP 136/84 mmHg   Pulse 72  Temp(Src) 98 F (36.7 C) (Oral)  Ht 5\' 6"  (1.676 m)  Wt 171 lb 6.4 oz (77.747 kg)  BMI 27.68 kg/m2  Physical Exam  Constitutional: He is oriented to person, place, and time. He appears well-developed and well-nourished. No distress.  HENT:  Head: Normocephalic and atraumatic.  Right Ear: External ear normal.  Left Ear: External ear normal.  Cardiovascular: Normal rate and regular rhythm.  Exam reveals no gallop and no friction rub.   No murmur heard. Pulmonary/Chest: Effort normal and breath sounds normal. No respiratory distress. He has no wheezes. He has no rales. He exhibits no tenderness.  Neurological: He is alert and oriented to person, place, and time.  Skin: Skin is warm and dry. No rash noted. He is not diaphoretic.  Psychiatric: He has a normal mood and affect. His behavior is normal. Judgment and thought content normal.   Assessment & Plan:   Casimiro NeedleMichael was seen today for establish care.  Diagnoses and all orders for this visit:  Encounter to establish care  Other acute sinusitis  Erectile dysfunction due to diseases classified elsewhere   I have discontinued Mr. Barraclough's fluticasone. I am also having him maintain his aspirin EC, albuterol, loratadine, carvedilol, sacubitril-valsartan, BIDIL, cloNIDine, hydrALAZINE, simvastatin, furosemide, potassium chloride SA, and acetaminophen.  Meds ordered this encounter  Medications  . acetaminophen (TYLENOL) 325 MG tablet    Sig: Take 650 mg by mouth every 6 (six) hours as needed.     Follow-up: Return in about 4 weeks (around 04/15/2015) for Follow up.

## 2015-03-18 NOTE — Patient Instructions (Signed)
Continue your medication around the clock.   MyChart me if fever of 101 or greater, ear pain on one side more than the other, redness/swelling, worsening of symptoms by 7 days of symptoms.   Follow up in 1 month.

## 2015-03-26 DIAGNOSIS — N529 Male erectile dysfunction, unspecified: Secondary | ICD-10-CM | POA: Insufficient documentation

## 2015-03-26 DIAGNOSIS — Z7689 Persons encountering health services in other specified circumstances: Secondary | ICD-10-CM | POA: Insufficient documentation

## 2015-03-26 NOTE — Assessment & Plan Note (Signed)
Discussed acute and chronic issues. Reviewed health maintenance measures, PFSHx, and immunizations. Obtain records from previous facilities.   

## 2015-03-26 NOTE — Assessment & Plan Note (Signed)
New onset. No signs bacterial infection. Continue flonase, tylenol, and OTC measures regularly and MyChart me in 7 days if no improvement or other measures outlined on AVS.

## 2015-03-26 NOTE — Assessment & Plan Note (Signed)
Intermittent at this time for the past 5 years. He reports he has not discussed this with his cardiologist. Pt is on clonidine and this is a side effect. I will defer to cardiology for answers regarding safety of sexual activity with heart conditions and treatment options.

## 2015-04-07 ENCOUNTER — Telehealth: Payer: Self-pay

## 2015-04-07 NOTE — Telephone Encounter (Signed)
This can be split into 2 pills which is much cheaper Just need to know how much he is taking and what his blood pressure is so we can adjust dose

## 2015-04-07 NOTE — Telephone Encounter (Signed)
Bidil is too expensive for pt.  He would like to know if separating rx into isosorbide & hydralazine would be a cheaper option.  Please advise.  Thank you.

## 2015-04-07 NOTE — Telephone Encounter (Signed)
Pt has some questions regarding his potassium and Bidil. He would like generic. Please call.

## 2015-04-08 NOTE — Telephone Encounter (Signed)
Spoke w/ pt.  Advised him of Dr. Windell HummingbirdGollan's recommendation.  He has a BP log at home. He will back this afternoon w/ readings or send MyChart message.

## 2015-04-16 ENCOUNTER — Ambulatory Visit (INDEPENDENT_AMBULATORY_CARE_PROVIDER_SITE_OTHER): Payer: BLUE CROSS/BLUE SHIELD | Admitting: Nurse Practitioner

## 2015-04-16 ENCOUNTER — Encounter: Payer: Self-pay | Admitting: Nurse Practitioner

## 2015-04-16 VITALS — BP 136/82 | HR 50 | Temp 97.4°F | Resp 14 | Ht 66.0 in | Wt 162.0 lb

## 2015-04-16 DIAGNOSIS — N521 Erectile dysfunction due to diseases classified elsewhere: Secondary | ICD-10-CM | POA: Diagnosis not present

## 2015-04-16 DIAGNOSIS — J018 Other acute sinusitis: Secondary | ICD-10-CM | POA: Diagnosis not present

## 2015-04-16 NOTE — Patient Instructions (Signed)
See you in July for a physical and fasting labs (nothing to eat or drink after midnight: except water or black coffee -no cream or sugar!)

## 2015-04-16 NOTE — Progress Notes (Signed)
Patient ID: Jake Levine, male    DOB: 1960/06/23  Age: 55 y.o. MRN: [id number]  CC: Follow-up   HPI Jake Levine presents for follow up of sinusitis and ED.   1) Sinusitis- He is improved. Finished medications  2) ED- Advised urology might be someone to consult with since medications are limited due to cardiac history.   History Jake Levine has a past medical history of Hypertension; Cardiomegaly; Sleep apnea; Hypertensive heart disease; CKD (chronic kidney disease), stage III; HLD (hyperlipidemia); NICM (nonischemic cardiomyopathy) (HCC); H/O medication noncompliance; Chronic systolic CHF (congestive heart failure) (HCC); Hilar lymphadenopathy; and Ascending aorta dilatation (HCC).   He has past surgical history that includes Tumor excision (Left, <2010); Facial fracture surgery (Right, ~ 1990); Vasectomy; and left heart catheterization with coronary angiogram (N/A, 04/20/2014).   His family history includes Alcohol abuse in his maternal grandfather, maternal uncle, and paternal grandmother; Cancer in his father, maternal grandfather, and maternal uncle; Colon cancer in his father; Diabetes in his father; Lupus in his sister; Stroke in his mother.He reports that he has never smoked. He has never used smokeless tobacco. He reports that he drinks about 1.2 oz of alcohol per week. He reports that he does not use illicit drugs.  Outpatient Prescriptions Prior to Visit  Medication Sig Dispense Refill  . acetaminophen (TYLENOL) 325 MG tablet Take 650 mg by mouth every 6 (six) hours as needed.    Marland Kitchen aspirin EC 81 MG tablet Take 81 mg by mouth daily.    Marland Kitchen BIDIL 20-37.5 MG per tablet TAKE 2 TABLETS BY MOUTH 3 (THREE) TIMES DAILY. 180 tablet 3  . carvedilol (COREG) 25 MG tablet Take 1 tablet (25 mg total) by mouth 2 (two) times daily with a meal. 90 tablet 3  . cloNIDine (CATAPRES) 0.2 MG tablet Take 1 tablet (0.2 mg total) by mouth 2 (two) times daily. 180 tablet 3  . furosemide (LASIX) 20 MG  tablet Take 1 tablet (20 mg total) by mouth daily. 30 tablet 6  . hydrALAZINE (APRESOLINE) 50 MG tablet Take 1 tablet (50 mg total) by mouth 4 (four) times daily as needed. 120 tablet 6  . loratadine (CLARITIN) 10 MG tablet Take 10 mg by mouth daily.    . potassium chloride SA (K-DUR,KLOR-CON) 20 MEQ tablet Take 2 tablets (40 mEq total) by mouth daily. 60 tablet 11  . sacubitril-valsartan (ENTRESTO) 97-103 MG Take 1 tablet by mouth 2 (two) times daily. 180 tablet 3  . simvastatin (ZOCOR) 20 MG tablet Take 1 tablet (20 mg total) by mouth daily at 6 PM. 90 tablet 3  . albuterol (PROVENTIL HFA;VENTOLIN HFA) 108 (90 BASE) MCG/ACT inhaler Inhale 1-2 puffs into the lungs every 6 (six) hours as needed for wheezing or shortness of breath. Reported on 04/16/2015     No facility-administered medications prior to visit.    ROS Review of Systems  Constitutional: Negative for fever, chills, diaphoresis and fatigue.  HENT: Negative for congestion, ear pain, postnasal drip, rhinorrhea, sinus pressure, sneezing and sore throat.   Eyes: Negative for visual disturbance.  Respiratory: Negative for chest tightness, shortness of breath and wheezing.   Cardiovascular: Negative for chest pain, palpitations and leg swelling.  Neurological: Negative for dizziness.    Objective:  BP 136/82 mmHg  Pulse 50  Temp(Src) 97.4 F (36.3 C) (Oral)  Resp 14  Ht 5\' 6"  (1.676 m)  Wt 162 lb (73.483 kg)  BMI 26.16 kg/m2  SpO2 97%  Physical Exam  Constitutional: He is oriented to  person, place, and time. He appears well-developed and well-nourished. No distress.  HENT:  Head: Normocephalic and atraumatic.  Right Ear: External ear normal.  Left Ear: External ear normal.  Eyes: EOM are normal. Pupils are equal, round, and reactive to light. Right eye exhibits no discharge. Left eye exhibits no discharge. No scleral icterus.  Cardiovascular: Regular rhythm.  Exam reveals no gallop and no friction rub.   No murmur  heard. Bradycardic  Pulmonary/Chest: Effort normal and breath sounds normal. No respiratory distress. He has no wheezes. He has no rales. He exhibits no tenderness.  Neurological: He is alert and oriented to person, place, and time.  Skin: Skin is warm and dry. No rash noted. He is not diaphoretic.  Psychiatric: He has a normal mood and affect. His behavior is normal. Judgment and thought content normal.   Assessment & Plan:   Jake NeedleMichael was seen today for follow-up.  Diagnoses and all orders for this visit:  Erectile dysfunction due to diseases classified elsewhere  Other acute sinusitis   I am having Mr. Jake Levine maintain his aspirin EC, albuterol, loratadine, carvedilol, sacubitril-valsartan, BIDIL, cloNIDine, hydrALAZINE, simvastatin, furosemide, potassium chloride SA, and acetaminophen.  No orders of the defined types were placed in this encounter.     Follow-up: Return in about 6 months (around 10/14/2015) for CPE w/ fasting labs.

## 2015-04-18 NOTE — Assessment & Plan Note (Signed)
Resolved FU prn worsening/failure to improve.

## 2015-04-18 NOTE — Assessment & Plan Note (Signed)
Discussed possible referral to urology. Declines at this time

## 2015-05-25 ENCOUNTER — Other Ambulatory Visit: Payer: Self-pay | Admitting: Cardiovascular Disease

## 2015-06-08 ENCOUNTER — Telehealth: Payer: Self-pay | Admitting: *Deleted

## 2015-06-08 NOTE — Telephone Encounter (Signed)
Pt requiring PA for Entresto 97/103 mg tablet. Awaiting MD signature and will forward to PA [employer].

## 2015-06-10 ENCOUNTER — Telehealth: Payer: Self-pay | Admitting: *Deleted

## 2015-06-10 NOTE — Telephone Encounter (Signed)
Spoke with pt and he is aware that his plan has expired with [employer].  He mentioned that he just started a new job and thought that "his plan with [employer] would kick in." He is aware that we do not have anything on file reflecting coverage at this time. Pt has been out of medication for 5 days now. Pt mentioned he will contact plan to see if he has any coverage until he establishes with current Employer. Please advise for we do not carry samples in this dose for Entresto.

## 2015-06-10 NOTE — Telephone Encounter (Signed)
Please see note below. 

## 2015-06-10 NOTE — Telephone Encounter (Signed)
Perhaps we can provide the lower dose entresto for now to carry him through 49/51 mg po BID

## 2015-06-10 NOTE — Telephone Encounter (Signed)
[facility]. PA could not be forwarded or processed due to No coverage with [employer] at the moment.

## 2015-06-10 NOTE — Telephone Encounter (Signed)
[facility] status.

## 2015-06-11 NOTE — Telephone Encounter (Signed)
Forwarding to Pam.

## 2015-06-11 NOTE — Telephone Encounter (Signed)
Left message for patient to call back in.

## 2015-06-14 ENCOUNTER — Telehealth: Payer: Self-pay | Admitting: Cardiovascular Disease

## 2015-06-14 NOTE — Telephone Encounter (Signed)
Patient had called in regarding lack of insurance during job transfer and medication costs. Patient to start new job next week and he hopes insurance will soon start. Placed 28 day supply of lower dose Entresto samples up front for patient to pick up along with discount card and telephone number to [employer] for further assistance. Patient will be by later today to pick up the samples. He had no further questions at this time.

## 2015-06-14 NOTE — Telephone Encounter (Signed)
Patient returning call from pam. See previous phone note.

## 2015-06-30 ENCOUNTER — Other Ambulatory Visit: Payer: Self-pay | Admitting: Cardiovascular Disease

## 2015-07-01 ENCOUNTER — Telehealth: Payer: Self-pay

## 2015-07-01 NOTE — Telephone Encounter (Signed)
LMOM to contact our office regarding Entresto prior authorization with [employer].

## 2015-07-05 NOTE — Telephone Encounter (Signed)
Prior Authorization faxed for Entresto 97 mg-103 mg tablets one tablet twice a day.  ID # [id number] Awaiting for approval.

## 2015-07-07 ENCOUNTER — Ambulatory Visit: Payer: BLUE CROSS/BLUE SHIELD | Admitting: Cardiovascular Disease

## 2015-07-09 ENCOUNTER — Telehealth: Payer: Self-pay

## 2015-07-09 NOTE — Telephone Encounter (Signed)
Notified Jake Levine & Jake Levine with [facility] the Sherryll Burgerntresto has been approved from 07/05/2015 to 04/02/2038.

## 2015-08-10 ENCOUNTER — Telehealth: Payer: Self-pay | Admitting: Cardiovascular Disease

## 2015-08-10 ENCOUNTER — Other Ambulatory Visit: Payer: Self-pay | Admitting: *Deleted

## 2015-08-10 MED ORDER — SACUBITRIL-VALSARTAN 97-103 MG PO TABS: 1.0000 | ORAL_TABLET | Freq: Two times a day (BID) | ORAL | Status: DC

## 2015-08-10 NOTE — Telephone Encounter (Signed)
*  STAT* If patient is at the pharmacy, call can be transferred to refill team.   1. Which medications need to be refilled? (please list name of each medication and dose if known)  sacubitril-valsartan (ENTRESTO) 97-103 MG   2. Which pharmacy/location (including street and city if local pharmacy) is medication to be sent to? [facility]  3. Do they need a 30 day or 90 day supply? 90 day supply  Patient needs samples of sacubitril-valsartan (ENTRESTO) 97-103 MG plus the coupon card / manufacturer card. Please call patient when ready for pick up.

## 2015-08-10 NOTE — Telephone Encounter (Signed)
LMOVM  No entresto 97-103 samples available in dosage requested. Rx Entresto 97-103 mg #180 R#3 [facility] Sherryll BurgerEntresto coupon card placed at front desk for pick up.

## 2015-08-10 NOTE — Telephone Encounter (Signed)
Requested Prescriptions   Signed Prescriptions Disp Refills  . sacubitril-valsartan (ENTRESTO) 97-103 MG 180 tablet 3    Sig: Take 1 tablet by mouth 2 (two) times daily.    Authorizing Provider: Antonieta IbaGOLLAN, TIMOTHY J    Ordering User: Kendrick FriesLOPEZ, MARINA C

## 2015-09-06 ENCOUNTER — Telehealth: Payer: Self-pay | Admitting: Cardiovascular Disease

## 2015-09-06 NOTE — Telephone Encounter (Signed)
This encounter was created in error - please disregard.

## 2015-09-06 NOTE — Telephone Encounter (Signed)
Pt is overdue for f/u. He should come in to discuss his options.   Do Ryan or Thayer OhmChris have any openings?

## 2015-09-06 NOTE — Telephone Encounter (Signed)
Pt states he is out of cardivelol, Bidel, Entresto. States he does not have insurance and will not be able to take them. Please call.

## 2015-09-07 NOTE — Telephone Encounter (Signed)
Called pt to offer appt for 6/7 with Dr. Almond LintAileen Ingal, pt refused, due to having to self pay. I offered the service of billing for his office visit, and pt still declined office visit. Informed pt of our Financial Aid Application, he was in agreement to complete form.Mailed form to pt to fill out and return.

## 2015-09-07 NOTE — Telephone Encounter (Signed)
The 1st available is with Dr. Mariah MillingGollan on 6/23.

## 2015-09-20 ENCOUNTER — Ambulatory Visit: Payer: BLUE CROSS/BLUE SHIELD | Admitting: Cardiovascular Disease

## 2015-10-18 ENCOUNTER — Encounter: Payer: BLUE CROSS/BLUE SHIELD | Admitting: Nurse Practitioner

## 2015-10-22 ENCOUNTER — Other Ambulatory Visit: Payer: Self-pay | Admitting: *Deleted

## 2015-10-22 MED ORDER — FUROSEMIDE 20 MG PO TABS: 20.0000 mg | ORAL_TABLET | Freq: Every day | ORAL | Status: DC

## 2015-10-22 NOTE — Telephone Encounter (Signed)
Requested Prescriptions   Signed Prescriptions Disp Refills  . furosemide (LASIX) 20 MG tablet 30 tablet 1    Sig: Take 1 tablet (20 mg total) by mouth daily.    Authorizing Provider: Antonieta IbaGOLLAN, TIMOTHY J    Ordering User: Kendrick FriesLOPEZ, MARINA C

## 2015-10-27 ENCOUNTER — Ambulatory Visit: Payer: BLUE CROSS/BLUE SHIELD | Admitting: Cardiovascular Disease

## 2015-10-27 ENCOUNTER — Encounter: Payer: BLUE CROSS/BLUE SHIELD | Admitting: Nurse Practitioner

## 2015-11-02 ENCOUNTER — Other Ambulatory Visit: Payer: Self-pay | Admitting: Cardiovascular Disease

## 2015-11-10 ENCOUNTER — Other Ambulatory Visit: Payer: Self-pay | Admitting: *Deleted

## 2015-11-10 MED ORDER — FUROSEMIDE 20 MG PO TABS: 20.0000 mg | ORAL_TABLET | Freq: Every day | ORAL | 1 refills | Status: DC

## 2016-01-05 ENCOUNTER — Other Ambulatory Visit: Payer: Self-pay | Admitting: Cardiovascular Disease

## 2016-01-07 ENCOUNTER — Other Ambulatory Visit: Payer: Self-pay | Admitting: Cardiovascular Disease

## 2016-01-10 ENCOUNTER — Telehealth: Payer: Self-pay | Admitting: Cardiovascular Disease

## 2016-01-10 NOTE — Telephone Encounter (Signed)
Please advise 

## 2016-01-10 NOTE — Telephone Encounter (Signed)
We haven't seen pt in over a year.  He will need an appt to discuss w/ Dr. Mariah MillingGollan.

## 2016-01-10 NOTE — Telephone Encounter (Signed)
Pt calling stating the entresto and Bidil we prescribed to him are a bit expensive and he can't afford these. Would like to know if there is another brand he can take or something to help with this Please advise

## 2016-01-14 ENCOUNTER — Other Ambulatory Visit: Payer: Self-pay | Admitting: Cardiovascular Disease

## 2016-01-18 ENCOUNTER — Encounter: Payer: Self-pay | Admitting: Cardiovascular Disease

## 2016-01-18 ENCOUNTER — Encounter (INDEPENDENT_AMBULATORY_CARE_PROVIDER_SITE_OTHER): Payer: Self-pay | Admitting: Cardiovascular Disease

## 2016-01-18 VITALS — Ht 66.0 in | Wt 173.8 lb

## 2016-01-18 DIAGNOSIS — I110 Hypertensive heart disease with heart failure: Secondary | ICD-10-CM

## 2016-01-18 DIAGNOSIS — I5021 Acute systolic (congestive) heart failure: Secondary | ICD-10-CM

## 2016-01-21 ENCOUNTER — Other Ambulatory Visit: Payer: Self-pay | Admitting: Cardiovascular Disease

## 2016-01-23 NOTE — Progress Notes (Signed)
Erroneous encounter

## 2016-01-31 ENCOUNTER — Other Ambulatory Visit: Payer: Self-pay | Admitting: Cardiovascular Disease

## 2016-02-07 ENCOUNTER — Ambulatory Visit (INDEPENDENT_AMBULATORY_CARE_PROVIDER_SITE_OTHER): Payer: BLUE CROSS/BLUE SHIELD | Admitting: Cardiovascular Disease

## 2016-02-07 ENCOUNTER — Encounter: Payer: Self-pay | Admitting: Cardiovascular Disease

## 2016-02-07 VITALS — BP 168/90 | HR 56 | Ht 66.0 in | Wt 176.0 lb

## 2016-02-07 DIAGNOSIS — E7800 Pure hypercholesterolemia, unspecified: Secondary | ICD-10-CM

## 2016-02-07 DIAGNOSIS — I10 Essential (primary) hypertension: Secondary | ICD-10-CM | POA: Diagnosis not present

## 2016-02-07 DIAGNOSIS — G4733 Obstructive sleep apnea (adult) (pediatric): Secondary | ICD-10-CM

## 2016-02-07 DIAGNOSIS — I5022 Chronic systolic (congestive) heart failure: Secondary | ICD-10-CM | POA: Diagnosis not present

## 2016-02-07 DIAGNOSIS — I420 Dilated cardiomyopathy: Secondary | ICD-10-CM

## 2016-02-07 DIAGNOSIS — N183 Chronic kidney disease, stage 3 unspecified: Secondary | ICD-10-CM

## 2016-02-07 MED ORDER — ISOSORBIDE MONONITRATE ER 30 MG PO TB24: 30.0000 mg | ORAL_TABLET | Freq: Every day | ORAL | 3 refills | Status: DC

## 2016-02-07 MED ORDER — HYDRALAZINE HCL 100 MG PO TABS: 100.0000 mg | ORAL_TABLET | Freq: Three times a day (TID) | ORAL | 3 refills | Status: DC

## 2016-02-07 MED ORDER — POTASSIUM CHLORIDE CRYS ER 20 MEQ PO TBCR: 20.0000 meq | EXTENDED_RELEASE_TABLET | Freq: Every day | ORAL | 3 refills | Status: DC

## 2016-02-07 MED ORDER — POTASSIUM CHLORIDE CRYS ER 20 MEQ PO TBCR: 40.0000 meq | EXTENDED_RELEASE_TABLET | Freq: Every day | ORAL | 3 refills | Status: DC

## 2016-02-07 MED ORDER — FUROSEMIDE 20 MG PO TABS: 20.0000 mg | ORAL_TABLET | Freq: Every day | ORAL | 3 refills | Status: DC

## 2016-02-07 MED ORDER — CARVEDILOL 25 MG PO TABS: 25.0000 mg | ORAL_TABLET | Freq: Two times a day (BID) | ORAL | 3 refills | Status: DC

## 2016-02-07 MED ORDER — HYDRALAZINE HCL 100 MG PO TABS: 50.0000 mg | ORAL_TABLET | Freq: Three times a day (TID) | ORAL | 3 refills | Status: DC

## 2016-02-07 MED ORDER — SACUBITRIL-VALSARTAN 97-103 MG PO TABS: 1.0000 | ORAL_TABLET | Freq: Two times a day (BID) | ORAL | 6 refills | Status: DC

## 2016-02-07 MED ORDER — SIMVASTATIN 20 MG PO TABS: 20.0000 mg | ORAL_TABLET | Freq: Every day | ORAL | 3 refills | Status: DC

## 2016-02-07 MED ORDER — CLONIDINE HCL 0.1 MG PO TABS: 0.1000 mg | ORAL_TABLET | Freq: Two times a day (BID) | ORAL | 3 refills | Status: DC

## 2016-02-07 NOTE — Progress Notes (Signed)
Cardiology Office Note  Date:  02/07/2016   ID:  Jake SimmondsMichael Regala, DOB 01/29/1961, MRN [id number]  PCP:  No PCP Per Patient   Chief Complaint  Patient presents with  . other    6 month follow up. Meds reviewed by the pt. verbally. "doing well."     HPI:  55 year old male with history of HTN (untreated for years), nonischemic cardiomyopathy, obstructive sleep apnea, previously on CPAP but reportedly improved with 70 pound weight loss who presents for follow up of his chronic systolic CHF and hypertensive heart disease.   In follow-up today, he has not been checking his blood pressure at home Insurance issues, cost issues with his medications Has not been taking BiDil clonidine. Has been taking entresto  once a day  , Coreg once a day He continues on Lasix with 1 potassium daily   he suspects blood pressure is chronically running high  Denies having significant  leg edema or abdominal bloating. Typically when he has fluid overload, he has leg edema, currently this is well controlled. No significant weight change Good exercise tolerance  Overall he feels well with no complaints EKG on today's visit shows sinus bradycardia rate 56 bpm, ST and T wave abnormality anterolateral leads  Other past medical history Long history of poorly controlled hypertension  [facility]1/19[facility]16 with acute systolic CF/dilated cardiomyopathy in the setting of severe hypertensive heart disease. BP was 172/115 upon presentation.  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  Echo on 04/18/14 showed an EF of 10-15%, Wall thickness wasincreased in a pattern of mild LVH. There was moderate concentric hypertrophy.  Unable to exclude RMA. GR3DD. Moderately dilated LA.   He underwent cardiac cath on 1/18 that showed widely patent coronary arteries, elevated LVEDP, known severe dilated cardiomyopathy. It was  suspected the patient had severe hypertensive heart disease and medical management was recommended.   PMH:   has a past medical history of Ascending aorta dilatation (HCC); Cardiomegaly; Chronic systolic CHF (congestive heart failure) (HCC); CKD (chronic kidney disease), stage III; H/O medication noncompliance; Hilar lymphadenopathy; HLD (hyperlipidemia); Hypertension; Hypertensive heart disease; NICM (nonischemic cardiomyopathy) (HCC); and Sleep apnea.  PSH:    Past Surgical History:  Procedure Laterality Date  . FACIAL FRACTURE SURGERY Right ~ 1990  . LEFT HEART CATHETERIZATION WITH CORONARY ANGIOGRAM N/A 04/20/2014   Procedure: LEFT HEART CATHETERIZATION WITH CORONARY ANGIOGRAM;  Surgeon: [name], MD;  Location: [facility] CATH LAB;  Service: Cardiovascular;  Laterality: N/A;  . TUMOR EXCISION Left <2010   "fatty growth"  . VASECTOMY      Current Outpatient Prescriptions  Medication Sig Dispense Refill  . acetaminophen (TYLENOL) 325 MG tablet Take 650 mg by mouth every 6 (six) hours as needed.    Marland Kitchen. albuterol (PROVENTIL HFA;VENTOLIN HFA) 108 (90 BASE) MCG/ACT inhaler Inhale 1-2 puffs into the lungs every 6 (six) hours as needed for wheezing or shortness of breath. Reported on 04/16/2015    . aspirin EC 81 MG tablet Take 81 mg by mouth daily.    . carvedilol (COREG) 25 MG tablet TAKE 1 TABLET (25 MG TOTAL) BY MOUTH 2 (TWO) TIMES DAILY WITH A MEAL. 90 tablet 3  . cloNIDine (CATAPRES) 0.2 MG tablet TAKE 1 TABLET (0.2 MG TOTAL) BY MOUTH 2 (TWO) TIMES DAILY. 180 tablet 3  . furosemide (LASIX) 20 MG tablet Take 1 tablet (20 mg total) by mouth daily. 30 tablet 1  .  hydrALAZINE (APRESOLINE) 50 MG tablet TAKE 1 TABLET (50 MG TOTAL) BY MOUTH 4 (FOUR) TIMES DAILY AS NEEDED. 120 tablet 3  . loratadine (CLARITIN) 10 MG tablet Take 10 mg by mouth daily.    . potassium chloride SA (K-DUR,KLOR-CON) 20 MEQ tablet Take 2 tablets (40 mEq total) by mouth daily. 60 tablet 11  . sacubitril-valsartan (ENTRESTO)  97-103 MG Take 1 tablet by mouth 2 (two) times daily. 180 tablet 3  . simvastatin (ZOCOR) 20 MG tablet TAKE 1 TABLET (20 MG TOTAL) BY MOUTH DAILY AT 6 PM. 90 tablet 0   No current facility-administered medications for this visit.      Allergies:   Patient has no known allergies.   Social History:  The patient  reports that he has never smoked. He has never used smokeless tobacco. He reports that he drinks about 1.2 oz of alcohol per week . He reports that he does not use drugs.   Family History:   family history includes Alcohol abuse in his maternal grandfather, maternal uncle, and paternal grandmother; Cancer in his father, maternal grandfather, and maternal uncle; Colon cancer in his father; Diabetes in his father; Lupus in his sister; Stroke in his mother.    Review of Systems: Review of Systems  Constitutional: Negative.   Respiratory: Negative.   Cardiovascular: Negative.   Gastrointestinal: Negative.   Musculoskeletal: Negative.   Neurological: Negative.   Psychiatric/Behavioral: Negative.   All other systems reviewed and are negative.    PHYSICAL EXAM: VS:  BP 138/78 (BP Location: Left Arm, Patient Position: Sitting, Cuff Size: Normal)   Pulse (!) 56   Ht 5\' 6"  (1.676 m)   Wt 176 lb (79.8 kg)   BMI 28.41 kg/m  , BMI Body mass index is 28.41 kg/m. GEN: Well nourished, well developed, in no acute distress  HEENT: normal  Neck: no JVD, carotid bruits, or masses Cardiac: RRR; no murmurs, rubs, or gallops,no edema  Respiratory:  clear to auscultation bilaterally, normal work of breathing GI: soft, nontender, nondistended, + BS MS: no deformity or atrophy  Skin: warm and dry, no rash Neuro:  Strength and sensation are intact Psych: euthymic mood, full affect    Recent Labs: No results found for requested labs within last 8760 hours.    Lipid Panel Lab Results  Component Value Date   CHOL 221 (H) 04/20/2014   HDL 67 04/20/2014   LDLCALC 145 (H) 04/20/2014    TRIG 44 04/20/2014      Wt Readings from Last 3 Encounters:  02/07/16 176 lb (79.8 kg)  01/18/16 173 lb 12 oz (78.8 kg)  04/16/15 162 lb (73.5 kg)       ASSESSMENT AND PLAN:  Accelerated hypertension - Plan: EKG 12-Lead Currently not taking many of his medications Recommended he stay on clonidine 0.1 mg twice a day Stay on entresto twice a day We will stop the BiDil as it is too expensive Increase hydralazine up to 100 mg 3 times a day Start isosorbide mononitrate 30 mg daily Stay on Lasix with potassium  Dilated cardiomyopathy (HCC) - Plan: EKG 12-Lead Appears relatively euvolemic Medication changes as above, stay on Lasix and potassium  Pure hypercholesterolemia Stay on simvastatin  Chronic systolic CHF (congestive heart failure) (HCC) - Plan: EKG 12-Lead  OSA (obstructive sleep apnea) - Plan: EKG 12-Lead Reports symptoms improved after weight loss  CKD (chronic kidney disease), stage III  BMP ordered for today, will be drawn in the office    Total encounter  time more than 25 minutes  Greater than 50% was spent in counseling and coordination of care with the patient   Disposition:   F/U  6 months   Orders Placed This Encounter  Procedures  . EKG 12-Lead     Signed, Dossie Arbourim Gollan, M.D., Ph.D. 02/07/2016  [facility], [place] [phone]

## 2016-02-07 NOTE — Patient Instructions (Addendum)
Medication Instructions:   Pleas take hydralazine 100 mg three times a day Start isosorbide one a day Take coreg 1/2 pill twice a day Please decrease clonidine to 0.1 mg twice a day Stay on entresto twice a day Stay on 1 furosemide & 1 potassium once a day  Labwork:  BMET  Testing/Procedures:  No further testing at this time   Follow-Up: It was a pleasure seeing you in the office today. Please call us if you have new issues that need to be addressed before your next appt.  [phone]  Your physician wants you to follow-up in: 6 months.  You will receive a reminder letter in the mail two months in advance. If you don't receive a letter, please call our office to schedule the follow-up appointment.  If you need a refill on your cardiac medications before your next appointment, please call your pharmacy.

## 2016-02-08 LAB — BASIC METABOLIC PANEL
BUN / CREAT RATIO: 16 (ref 9–20)
BUN: 20 mg/dL (ref 6–24)
CO2: 27 mmol/L (ref 18–29)
CREATININE: 1.24 mg/dL (ref 0.76–1.27)
Calcium: 9.6 mg/dL (ref 8.7–10.2)
Chloride: 103 mmol/L (ref 96–106)
GFR, EST AFRICAN AMERICAN: 76 mL/min/{1.73_m2} (ref >59)
GFR, EST NON AFRICAN AMERICAN: 66 mL/min/{1.73_m2} (ref >59)
Glucose: 95 mg/dL (ref 65–99)
Potassium: 4.2 mmol/L (ref 3.5–5.2)
SODIUM: 145 mmol/L — AB (ref 134–144)

## 2016-03-03 ENCOUNTER — Other Ambulatory Visit: Payer: Self-pay | Admitting: Cardiovascular Disease

## 2016-03-06 ENCOUNTER — Telehealth: Payer: Self-pay | Admitting: Cardiovascular Disease

## 2016-03-06 NOTE — Telephone Encounter (Signed)
Pt is calling stating he is having issues with insurance and entreso   Would like to know of another option  Please advise

## 2016-03-06 NOTE — Telephone Encounter (Signed)
LMOM to contact our office back regarding the medication entresto.

## 2016-03-10 NOTE — Telephone Encounter (Signed)
Patient returning our call regarding Entresto, the patient can't afford, the cost is $280.00 for one month supply.  The patient stated Dr. Mariah MillingGollan mentioned a combination drug at his last office visit and would like for us to call that [facility] [address].  Please advise what medication.

## 2016-03-12 NOTE — Telephone Encounter (Signed)
Please call company Need to exhaust all assistance options

## 2016-03-16 NOTE — Telephone Encounter (Signed)
Faxed prior authorization form for [facility] for approval.

## 2016-03-17 NOTE — Telephone Encounter (Signed)
[facility] us information that he does not have Rx benefits with [employer] of [employer] and could he please verify this information in order for us to get the prior authorization for Entresto taking care of.

## 2016-03-20 NOTE — Telephone Encounter (Signed)
Spoke with [employer] apparently the patient does not have Rx benefits with [employer].  I have left a message for the patient to call back on whom he has Rx benefits with because [employer] is stating he has no Rx benefit coverage.

## 2016-03-20 NOTE — Telephone Encounter (Signed)
Verified patient insurance which is [facility] [employer] # [id number] ID# [id number].  The patient will pick up Entresto samples of Entresto 49/51 mg, he will take 2 tablets daily until we can get the [facility] approved.  Entresto 49/51 mg Lot # [id number] Exp. 01/2017, 2 boxes gives.

## 2016-03-21 NOTE — Telephone Encounter (Signed)
LMOM for the patient to call back regarding his Rx benefits for medications.

## 2016-04-07 ENCOUNTER — Telehealth: Payer: Self-pay | Admitting: Cardiovascular Disease

## 2016-04-07 NOTE — Telephone Encounter (Signed)
Pt states he is still waiting for an alternative for Entresto. States his BP this morning was 202/124, before medication. Please call.

## 2016-04-07 NOTE — Telephone Encounter (Signed)
Jake Levine    04/07/16 9:07 AM  Note    Pt states he is still waiting for an alternative for Entresto. States his BP this morning was 202/124, before medication. Please call.

## 2016-04-07 NOTE — Telephone Encounter (Signed)
Spoke w/ pt.  He reports that he is at work right now, feels that his meds have "kicked in", as "I have that relaxed feeling I get an hour or 2 after they start working". He does not have access to a BP cuff right now. Asked him to call me w/ reading as soon as he is able to check it. He states that he has new ins and his card is at home, so he will call back and provide this info to get his Sherryll Burgerntresto covered. He ran out of [facility] and does not have anymore. Advised pt that I am leaving samples of Entresto 49/51 mg, that he will need to take 2 pills twice daily.  He is appreciative and will come by to pick these up at lunch time.

## 2016-04-12 NOTE — Telephone Encounter (Signed)
Patient Calls   Jake HalstedAmanda R Moody, RN routed conversation to You 5 days ago    Jake HalstedAmanda R Moody, RN 5 days ago    Spoke w/ pt.  He reports that he is at work right now, feels that his meds have "kicked in", as "I have that relaxed feeling I get an hour or 2 after they start working". He does not have access to a BP cuff right now. Asked him to call me w/ reading as soon as he is able to check it. He states that he has new ins and his card is at home, so he will call back and provide this info to get his Sherryll Burgerntresto covered. He ran out of [facility] and does not have anymore. Advised pt that I am leaving samples of Entresto 49/51 mg, that he will need to take 2 pills twice daily.  He is appreciative and will come by to pick these up at lunch time.

## 2016-04-14 ENCOUNTER — Other Ambulatory Visit: Payer: Self-pay

## 2016-04-14 MED ORDER — SACUBITRIL-VALSARTAN 97-103 MG PO TABS: 1.0000 | ORAL_TABLET | Freq: Two times a day (BID) | ORAL | 6 refills | Status: DC

## 2016-04-17 NOTE — Telephone Encounter (Signed)
Pt is calling stating he took his last 2 Entresto this morning. He would like to know the status of this medication being changed. States since November he has not been able to be approved for [employer]. Please call.

## 2016-04-17 NOTE — Telephone Encounter (Signed)
Spoke w/ pt.  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 He is at work right now, will check to see if he still has this form when he gets home. If not, asked to call back and I will mail him another one.    Advised pt that there is no alternative to [facility], but we can provided him w/ samples. He states that Dr. Mariah MillingGollan mentioned an "older medicine" that is more affordable than Entresto and that this is why he keeps calling, to see if he can be switched to this.  Advised him that I will make Dr. Mariah MillingGollan aware and call him back w/ his recommendation.

## 2016-04-18 NOTE — Telephone Encounter (Signed)
If unable to afford the entresto, Would change to losartan 100 mg daily Would also confirm his other blood pressure medications History of medication noncompliance possibly secondary to financial issues Would refer to my note assessment and plan for list of medications

## 2016-04-18 NOTE — Telephone Encounter (Signed)
Left message for pt to call back  °

## 2016-04-24 ENCOUNTER — Telehealth: Payer: Self-pay | Admitting: Cardiovascular Disease

## 2016-04-24 NOTE — Telephone Encounter (Signed)
Pt stopped by this morning, dropped off Enrollment Application for Entresto, placed in nurses box, also states he is out of his samples.

## 2016-04-24 NOTE — Telephone Encounter (Signed)
Entresto Pt assistance forms faxed. Originals and samples of Entresto 49-51 mg placed at the front desk for pt to p/u at his convenience. Advised pt to take 2 pills BID, as we do not have samples of 97-103 mg tabs. Advised him that if the pt assistance does not help him, to call back and we will switch him to losartan, per previous phone note.  He is appreciative and will call back when he receives a response.

## 2016-04-26 ENCOUNTER — Telehealth: Payer: Self-pay | Admitting: Cardiovascular Disease

## 2016-04-26 NOTE — Telephone Encounter (Signed)
[employer] called and states there was no Dr. Lenox PondsSignature or date on assistant forms. Please call.

## 2016-04-26 NOTE — Telephone Encounter (Signed)
Jake Levine is faxing form back for MD signature and copy of pt's ins cards.

## 2016-04-26 NOTE — Telephone Encounter (Signed)
Completed paperwork faxed to [employer] Pt Assistance @ [phone].

## 2016-05-01 ENCOUNTER — Telehealth: Payer: Self-pay | Admitting: Cardiovascular Disease

## 2016-05-01 NOTE — Telephone Encounter (Signed)
[employer] calling to give us instructions on next step to help patient with medication   Prior auth needed through [employer] [phone]   Please call back if we have any questions.

## 2016-05-02 NOTE — Telephone Encounter (Signed)
[employer] returning call please see note concerning PA.

## 2016-05-03 NOTE — Telephone Encounter (Signed)
LMOM with the patient the Sherryll Burgerntresto has been approved for 3 years through [employer].

## 2016-05-03 NOTE — Telephone Encounter (Signed)
Spoke with Brett CanalesSteve with [employer] regarding a prior authorization for Entresto 97-103 mg one tablet twice a day. The patient has been approved for 3 years with the start date of 05/03/2016. Approval #  [id number].

## 2016-05-11 ENCOUNTER — Telehealth: Payer: Self-pay | Admitting: Cardiovascular Disease

## 2016-05-11 NOTE — Telephone Encounter (Signed)
Attempted to contact Sabria.  She did not leave an extension #.   Per last phone note 05/01/16:  Festus AloeSharon G Crespo, CMA    9:55 AM  Note    LMOM with the patient the Jake Levine has been approved for 3 years through [employer].     Festus AloeSharon G Crespo, CMA    9:48 AM  Note    Spoke with Brett CanalesSteve with [employer] regarding a prior authorization for Entresto 97-103 mg one tablet twice a day. The patient has been approved for 3 years with the start date of 05/03/2016. Approval #  [id number].

## 2016-05-11 NOTE — Telephone Encounter (Signed)
[employer] calling regarding status of PA for [employer]o. Please call.

## 2016-05-16 ENCOUNTER — Telehealth: Payer: Self-pay | Admitting: Cardiovascular Disease

## 2016-05-16 NOTE — Telephone Encounter (Signed)
Entresto lot: [id number] Exp: 10/2017

## 2016-05-16 NOTE — Telephone Encounter (Signed)
Patient calling the office for samples of medication:   1.  What medication and dosage are you requesting samples for? entresto  2.  Are you currently out of this medication?  Yes

## 2016-05-16 NOTE — Telephone Encounter (Signed)
Left samples for Entresto49mg /51mg  at the front for pick up, with instructions to take 2tabs twice daily per Dr.Gollan. Notified pt. Lot:

## 2016-05-30 ENCOUNTER — Telehealth: Payer: Self-pay | Admitting: Cardiovascular Disease

## 2016-05-30 NOTE — Telephone Encounter (Signed)
Pt calling to follow up on PA we were doing for the [facility]  Please call back

## 2016-05-31 NOTE — Telephone Encounter (Signed)
Note   LMOM have patient contact our office regarding the Entresto. See notes below Sherryll Burgerntresto has been approved for 3 years through [employer].    Attempted to contact Sabria.  She did not leave an extension #.   Per last phone note 05/01/16:  Festus AloeSharon G Jamal Haskin, CMA    9:55 AM  Note    LMOM with the patient the Sherryll Burgerntresto has been approved for 3 years through [employer].     Festus AloeSharon G Makaylin Carlo, CMA    9:48 AM  Note    Spoke with Brett CanalesSteve with [employer] regarding a prior authorization for Entresto 97-103 mg one tablet twice a day. The patient has been approved for 3 years with the start date of 05/03/2016. Approval # [id number].

## 2016-06-05 ENCOUNTER — Telehealth: Payer: Self-pay | Admitting: Cardiovascular Disease

## 2016-06-05 MED ORDER — LOSARTAN POTASSIUM 100 MG PO TABS: 100.0000 mg | ORAL_TABLET | Freq: Every day | ORAL | 3 refills | Status: DC

## 2016-06-05 NOTE — Telephone Encounter (Signed)
Pt states his approval for Sherryll Burgerntresto was $500 per month. States he would like an alternative, as this is too expensive. Please call.

## 2016-06-05 NOTE — Telephone Encounter (Signed)
Spoke w/ pt.  He reports that he does not qualify for pt assistance, as he has ins. Advised him of previous conversation:  If unable to afford the entresto, Would change to losartan 100 mg daily  Would like to switch to losartan and be done w/ entresto, as he is tired of fighting for it.  Asked him to call back if we can be of further assistance.

## 2016-10-30 NOTE — Progress Notes (Signed)
Cardiology Office Note  Date:  10/31/2016   ID:  Jake Levine, DOB [birth year], MRN [id number]  PCP:  Patient, No Pcp Per   Chief Complaint  Patient presents with  . other    6 month follow up. Patient states he is " feeling pretty good" Meds reviewed verbally with patient,    HPI:   56 year old male with history of  HTN (untreated for years),  nonischemic cardiomyopathy, Ejection fraction down to 10-15% in 2016 obstructive sleep apnea, previously on CPAP but reportedly improved with 70 pound weight loss who presents for follow up of his chronic systolic CHF and hypertensive heart disease.   He has been checking blood pressure at home, running high in the morning He's not been taking losartan Only taking hydralazine once a day secondary to cost reasons Getting many of his medications from [facility] Was unable to afford enetrsto was changed to losartan but he does not remember this change He continues on Lasix with 1 potassium daily   Denies having significant  leg edema or abdominal bloating.  Typically when he has fluid overload, he has leg edema, currently this is well controlled. No significant weight change Good exercise tolerance  Overall he feels well with no complaints  EKG on today's visit shows sinus bradycardia rate 56 bpm, ST and T wave abnormality anterolateral leads  Other past medical history Long history of poorly controlled hypertension  admitted to [facility] 1/15-1/19/16 with acute systolic CF/dilated cardiomyopathy in the setting of severe hypertensive heart disease. BP was 172/115 upon presentation.  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  Echo on 04/18/14 showed an EF of 10-15%, Wall thickness wasincreased in a pattern of mild LVH. There was moderate concentric hypertrophy.  Unable to exclude RMA. GR3DD. Moderately dilated LA.   He underwent cardiac cath on 1/18  that showed widely patent coronary arteries, elevated LVEDP, known severe dilated cardiomyopathy. It was suspected the patient had severe hypertensive heart disease and medical management was recommended.   PMH:   has a past medical history of Ascending aorta dilatation (HCC); Cardiomegaly; Chronic systolic CHF (congestive heart failure) (HCC); CKD (chronic kidney disease), stage III; H/O medication noncompliance; Hilar lymphadenopathy; HLD (hyperlipidemia); Hypertension; Hypertensive heart disease; NICM (nonischemic cardiomyopathy) (HCC); and Sleep apnea.  PSH:    Past Surgical History:  Procedure Laterality Date  . FACIAL FRACTURE SURGERY Right ~ 1990  . LEFT HEART CATHETERIZATION WITH CORONARY ANGIOGRAM N/A 04/20/2014   Procedure: LEFT HEART CATHETERIZATION WITH CORONARY ANGIOGRAM;  Surgeon: Micheline Chapman, MD;  Location: [facility] CATH LAB;  Service: Cardiovascular;  Laterality: N/A;  . TUMOR EXCISION Left <2010   "fatty growth"  . VASECTOMY      Current Outpatient Prescriptions  Medication Sig Dispense Refill  . acetaminophen (TYLENOL) 325 MG tablet Take 650 mg by mouth every 6 (six) hours as needed.    Marland Kitchen albuterol (PROVENTIL HFA;VENTOLIN HFA) 108 (90 BASE) MCG/ACT inhaler Inhale 1-2 puffs into the lungs every 6 (six) hours as needed for wheezing or shortness of breath. Reported on 04/16/2015    . aspirin EC 81 MG tablet Take 81 mg by mouth daily.    . carvedilol (COREG) 25 MG tablet Take 1 tablet (25 mg total) by mouth 2 (two) times daily with a meal. 180 tablet 3  . cloNIDine (CATAPRES) 0.1 MG tablet Take 1 tablet (0.1 mg total) by mouth 2 (two) times daily. 180 tablet 3  .  furosemide (LASIX) 20 MG tablet Take 1 tablet (20 mg total) by mouth daily. 90 tablet 3  . hydrALAZINE (APRESOLINE) 100 MG tablet Take 1 tablet (100 mg total) by mouth 3 (three) times daily. 270 tablet 3  . loratadine (CLARITIN) 10 MG tablet Take 10 mg by mouth daily.    . potassium chloride SA (K-DUR,KLOR-CON) 20 MEQ  tablet Take 1 tablet (20 mEq total) by mouth daily. 90 tablet 3  . simvastatin (ZOCOR) 20 MG tablet Take 1 tablet (20 mg total) by mouth daily at 6 PM. 90 tablet 3  . isosorbide mononitrate (IMDUR) 30 MG 24 hr tablet Take 1 tablet (30 mg total) by mouth daily. 90 tablet 3  . losartan (COZAAR) 100 MG tablet Take 1 tablet (100 mg total) by mouth daily. 90 tablet 3   No current facility-administered medications for this visit.      Allergies:   Patient has no known allergies.   Social History:  The patient  reports that he has never smoked. He has never used smokeless tobacco. He reports that he drinks about 1.2 oz of alcohol per week . He reports that he does not use drugs.   Family History:   family history includes Alcohol abuse in his maternal grandfather, maternal uncle, and paternal grandmother; Cancer in his father, maternal grandfather, and maternal uncle; Colon cancer in his father; Diabetes in his father; Lupus in his sister; Stroke in his mother.    Review of Systems: Review of Systems  Constitutional: Negative.   Respiratory: Negative.   Cardiovascular: Negative.   Gastrointestinal: Negative.   Musculoskeletal: Negative.   Neurological: Negative.   Psychiatric/Behavioral: Negative.   All other systems reviewed and are negative.    PHYSICAL EXAM: VS:  BP (!) 160/100 (BP Location: Left Arm, Patient Position: Sitting, Cuff Size: Normal)   Pulse (!) 56   Ht 5\' 6"  (1.676 m)   Wt 171 lb (77.6 kg)   BMI 27.60 kg/m  , BMI Body mass index is 27.6 kg/m.  GEN: Well nourished, well developed, in no acute distress  HEENT: normal  Neck: no JVD, carotid bruits, or masses Cardiac: RRR; no murmurs, rubs, or gallops,no edema  Respiratory:  clear to auscultation bilaterally, normal work of breathing GI: soft, nontender, nondistended, + BS MS: no deformity or atrophy  Skin: warm and dry, no rash Neuro:  Strength and sensation are intact Psych: euthymic mood, full  affect    Recent Labs: 02/07/2016: BUN 20; Creatinine, Ser 1.24; Potassium 4.2; Sodium 145    Lipid Panel Lab Results  Component Value Date   CHOL 221 (H) 04/20/2014   HDL 67 04/20/2014   LDLCALC 145 (H) 04/20/2014   TRIG 44 04/20/2014      Wt Readings from Last 3 Encounters:  10/31/16 171 lb (77.6 kg)  02/07/16 176 lb (79.8 kg)  01/18/16 173 lb 12 oz (78.8 kg)       ASSESSMENT AND PLAN:   Accelerated hypertension - Plan: EKG 12-Lead All of his medications were renewed, we went through each of the medications discussed but they were for Some medication compliance issue secondary to cost We will restart losartan 100 mg daily recommended he take this and isosorbide right before bed as blood pressure is elevated first thing in the morning He'll continue to check blood pressure at home and call our office if this continues to run high  Dilated cardiomyopathy (HCC) - Plan: EKG 12-Lead Appears relatively euvolemic Medication changes as above, stay on Lasix  and potassium Order placed to have basic metabolic panel checked today  Pure hypercholesterolemia Stay on simvastatin  Chronic systolic CHF (congestive heart failure) (HCC) - Plan: EKG 12-Lead Recommended repeat echocardiogram, last study January 2016 when ejection fraction was 10-15%  OSA (obstructive sleep apnea) - Plan: EKG 12-Lead Reports symptoms improved after weight loss Continues to maintain his weight  CKD (chronic kidney disease), stage III  BMP ordered for today, will be drawn in the office    Total encounter time more than 25 minutes  Greater than 50% was spent in counseling and coordination of care with the patient   Disposition:   F/U  6 months   No orders of the defined types were placed in this encounter.    Signed, Dossie Arbourim Suhaylah Wampole, M.D., Ph.D. 10/31/2016  [facility], [place] [phone]

## 2016-10-31 ENCOUNTER — Other Ambulatory Visit: Payer: Self-pay | Admitting: Cardiovascular Disease

## 2016-10-31 ENCOUNTER — Encounter: Payer: Self-pay | Admitting: Cardiovascular Disease

## 2016-10-31 ENCOUNTER — Ambulatory Visit (INDEPENDENT_AMBULATORY_CARE_PROVIDER_SITE_OTHER): Payer: BLUE CROSS/BLUE SHIELD | Admitting: Cardiovascular Disease

## 2016-10-31 VITALS — BP 160/100 | HR 56 | Ht 66.0 in | Wt 171.0 lb

## 2016-10-31 DIAGNOSIS — I5022 Chronic systolic (congestive) heart failure: Secondary | ICD-10-CM

## 2016-10-31 DIAGNOSIS — G4733 Obstructive sleep apnea (adult) (pediatric): Secondary | ICD-10-CM

## 2016-10-31 DIAGNOSIS — N183 Chronic kidney disease, stage 3 unspecified: Secondary | ICD-10-CM

## 2016-10-31 DIAGNOSIS — I77810 Thoracic aortic ectasia: Secondary | ICD-10-CM

## 2016-10-31 DIAGNOSIS — Z9114 Patient's other noncompliance with medication regimen: Secondary | ICD-10-CM | POA: Diagnosis not present

## 2016-10-31 MED ORDER — SIMVASTATIN 20 MG PO TABS
20.0000 mg | ORAL_TABLET | Freq: Every day | ORAL | 3 refills | Status: DC
Start: 2016-10-31 — End: 2017-02-09

## 2016-10-31 MED ORDER — FUROSEMIDE 20 MG PO TABS: 20.0000 mg | ORAL_TABLET | Freq: Every day | ORAL | 3 refills | Status: DC

## 2016-10-31 MED ORDER — CLONIDINE HCL 0.1 MG PO TABS: 0.1000 mg | ORAL_TABLET | Freq: Two times a day (BID) | ORAL | 3 refills | Status: DC

## 2016-10-31 MED ORDER — POTASSIUM CHLORIDE CRYS ER 20 MEQ PO TBCR: 20.0000 meq | EXTENDED_RELEASE_TABLET | Freq: Every day | ORAL | 3 refills | Status: DC

## 2016-10-31 MED ORDER — CARVEDILOL 25 MG PO TABS: 25.0000 mg | ORAL_TABLET | Freq: Two times a day (BID) | ORAL | 3 refills | Status: DC

## 2016-10-31 MED ORDER — HYDRALAZINE HCL 100 MG PO TABS: 100.0000 mg | ORAL_TABLET | Freq: Three times a day (TID) | ORAL | 3 refills | Status: DC

## 2016-10-31 MED ORDER — LOSARTAN POTASSIUM 100 MG PO TABS: 100.0000 mg | ORAL_TABLET | Freq: Every day | ORAL | 3 refills | Status: DC

## 2016-10-31 MED ORDER — ISOSORBIDE MONONITRATE ER 30 MG PO TB24
30.0000 mg | ORAL_TABLET | Freq: Every day | ORAL | 3 refills | Status: DC
Start: 2016-10-31 — End: 2017-12-09

## 2016-10-31 NOTE — Patient Instructions (Addendum)
Medication Instructions:   No medication changes made  Please stay on the same meds Consider taking losartan and imdur/isosorbide right before bed  Labwork:  No new labs needed  Testing/Procedures:  We will order an echo  Your physician has requested that you have an echocardiogram. Echocardiography is a painless test that uses sound waves to create images of your heart. It provides your doctor with information about the size and shape of your heart and how well your heart's chambers and valves are working. This procedure takes approximately one hour. There are no restrictions for this procedure.  Follow-Up: It was a pleasure seeing you in the office today. Please call us if you have new issues that need to be addressed before your next appt.  [phone]  Your physician wants you to follow-up in: 6 months.  You will receive a reminder letter in the mail two months in advance. If you don't receive a letter, please call our office to schedule the follow-up appointment.  If you need a refill on your cardiac medications before your next appointment, please call your pharmacy.    Echocardiogram An echocardiogram, or echocardiography, uses sound waves (ultrasound) to produce an image of your heart. The echocardiogram is simple, painless, obtained within a short period of time, and offers valuable information to your health care provider. The images from an echocardiogram can provide information such as:  Evidence of coronary artery disease (CAD).  Heart size.  Heart muscle function.  Heart valve function.  Aneurysm detection.  Evidence of a past heart attack.  Fluid buildup around the heart.  Heart muscle thickening.  Assess heart valve function.  Tell a health care provider about:  Any allergies you have.  All medicines you are taking, including vitamins, herbs, eye drops, creams, and over-the-counter medicines.  Any problems you or family members have had with  anesthetic medicines.  Any blood disorders you have.  Any surgeries you have had.  Any medical conditions you have.  Whether you are pregnant or may be pregnant. What happens before the procedure? No special preparation is needed. Eat and drink normally. What happens during the procedure?  In order to produce an image of your heart, gel will be applied to your chest and a wand-like tool (transducer) will be moved over your chest. The gel will help transmit the sound waves from the transducer. The sound waves will harmlessly bounce off your heart to allow the heart images to be captured in real-time motion. These images will then be recorded.  You may need an IV to receive a medicine that improves the quality of the pictures. What happens after the procedure? You may return to your normal schedule including diet, activities, and medicines, unless your health care provider tells you otherwise. This information is not intended to replace advice given to you by your health care provider. Make sure you discuss any questions you have with your health care provider. Document Released: 03/17/2000 Document Revised: 11/06/2015 Document Reviewed: 11/25/2012 Elsevier Interactive Patient Education  2017 [employer]

## 2016-11-01 LAB — BASIC METABOLIC PANEL
BUN / CREAT RATIO: 11 (ref 9–20)
BUN: 14 mg/dL (ref 6–24)
CO2: 26 mmol/L (ref 20–29)
CREATININE: 1.32 mg/dL — AB (ref 0.76–1.27)
Calcium: 9.2 mg/dL (ref 8.7–10.2)
Chloride: 104 mmol/L (ref 96–106)
GFR calc Af Amer: 70 mL/min/{1.73_m2} (ref >59)
GFR calc non Af Amer: 61 mL/min/{1.73_m2} (ref >59)
GLUCOSE: 83 mg/dL (ref 65–99)
Potassium: 4.2 mmol/L (ref 3.5–5.2)
Sodium: 144 mmol/L (ref 134–144)

## 2016-11-03 ENCOUNTER — Other Ambulatory Visit: Payer: Self-pay

## 2016-11-03 ENCOUNTER — Ambulatory Visit (INDEPENDENT_AMBULATORY_CARE_PROVIDER_SITE_OTHER): Payer: BLUE CROSS/BLUE SHIELD

## 2016-11-03 DIAGNOSIS — G4733 Obstructive sleep apnea (adult) (pediatric): Secondary | ICD-10-CM | POA: Diagnosis not present

## 2016-11-03 DIAGNOSIS — I5022 Chronic systolic (congestive) heart failure: Secondary | ICD-10-CM

## 2016-11-03 DIAGNOSIS — I77810 Thoracic aortic ectasia: Secondary | ICD-10-CM

## 2017-02-09 ENCOUNTER — Other Ambulatory Visit: Payer: Self-pay | Admitting: Cardiovascular Disease

## 2017-02-11 IMAGING — CT CT ANGIO CHEST
2 of 6 series · 18 of 36 positions shown · IV contrast (APPLIED)
Comparison: CT chest of 04/17/2014

CLINICAL DATA: Chest pain for several days

EXAM:
CT ANGIOGRAPHY CHEST WITH CONTRAST
TECHNIQUE: Multidetector CT imaging of the chest was performed using the
standard protocol during bolus administration of intravenous
contrast. Multiplanar CT image reconstructions and MIPs were
obtained to evaluate the vascular anatomy.
CONTRAST:  100mL OMNIPAQUE IOHEXOL 350 MG/ML SOLN

[Series 6: arterial · axial · arterial · 0.73mm/px · z∈[-550,-256]mm · 17 of 165 slices shown]
[im 9/165  lung]
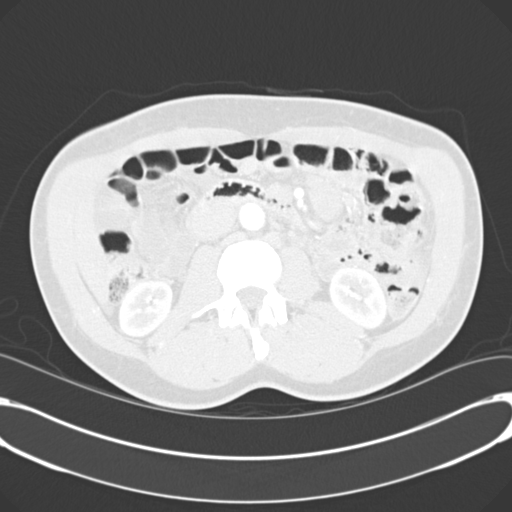
[im 18/165  mediastinal]
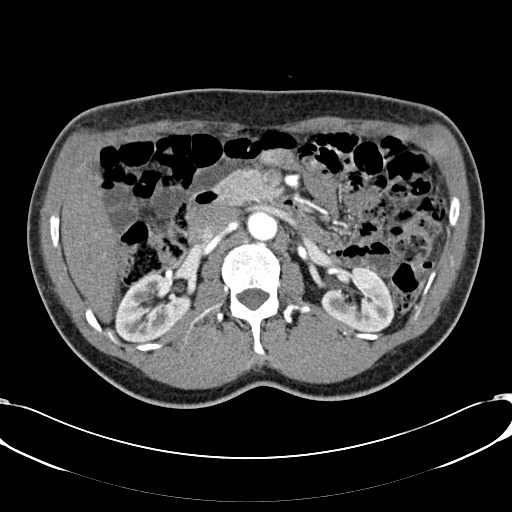
[im 26/165  lung]
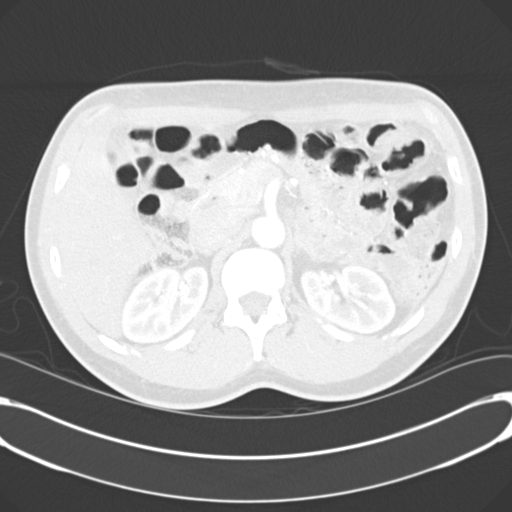
[im 35/165  mediastinal]
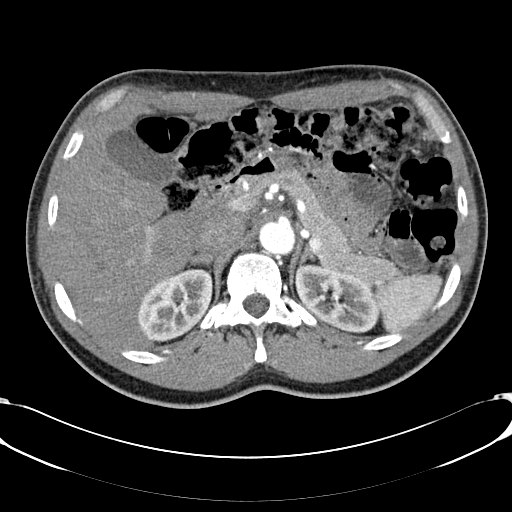
[im 44/165  lung]
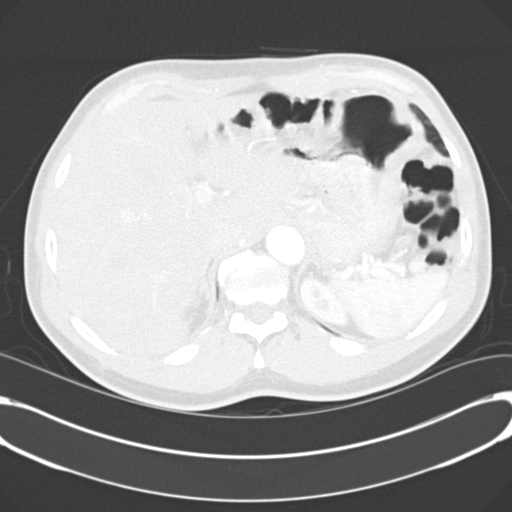
[im 52/165  mediastinal]
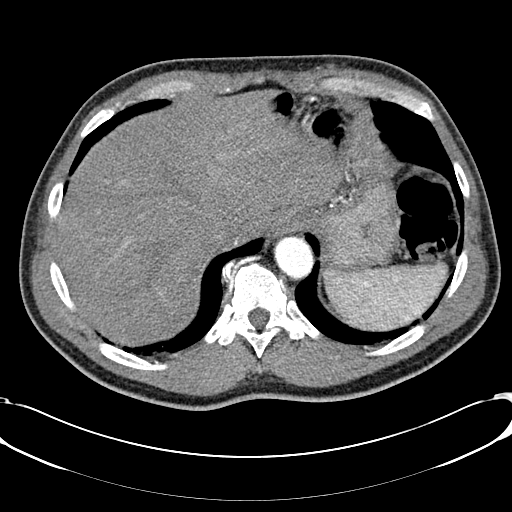
[im 61/165  lung]
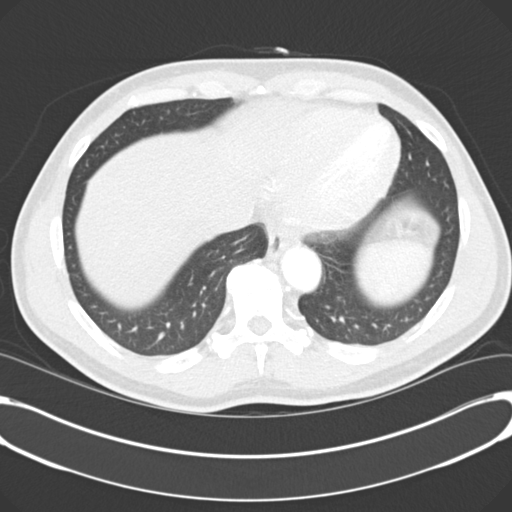
[im 70/165  mediastinal]
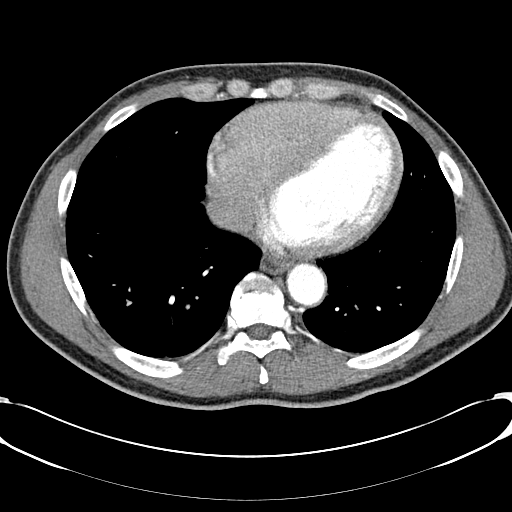
[im 87/165  lung]
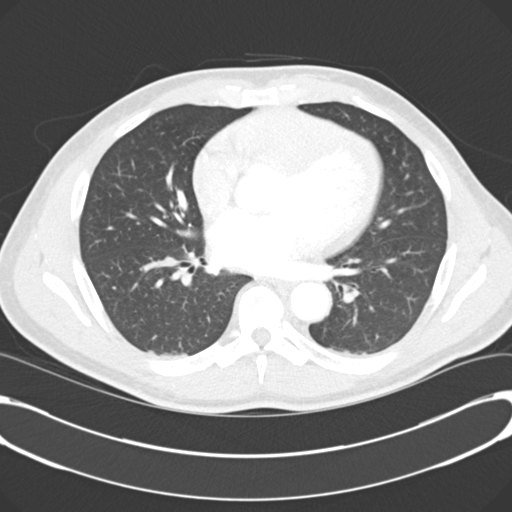
[im 95/165  mediastinal]
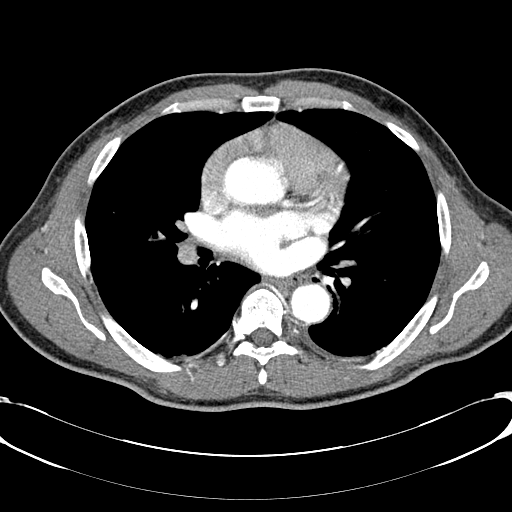
[im 104/165  lung]
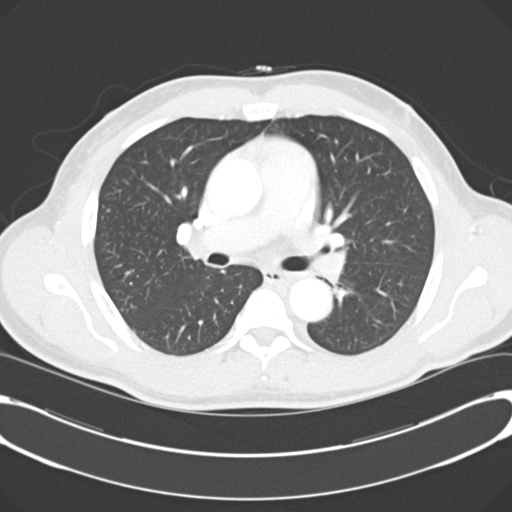
[im 113/165  mediastinal]
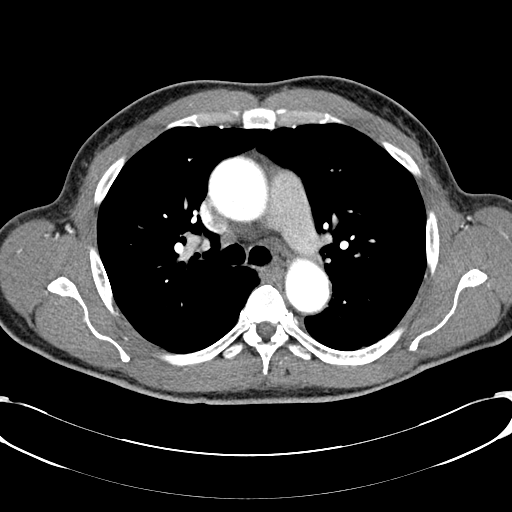
[im 121/165  lung]
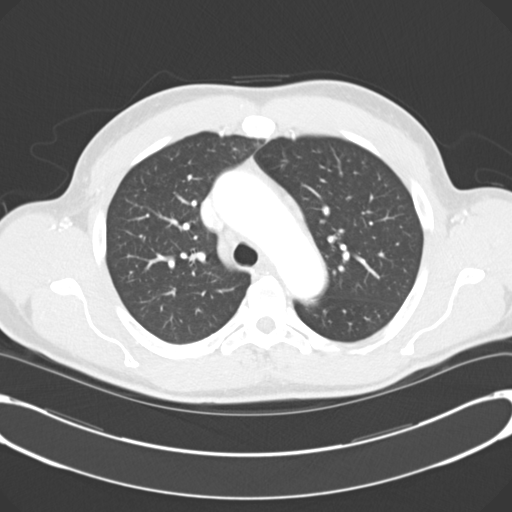
[im 130/165  mediastinal]
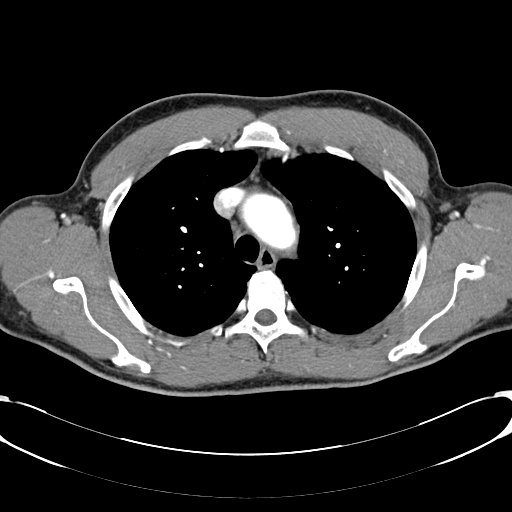
[im 139/165  lung]
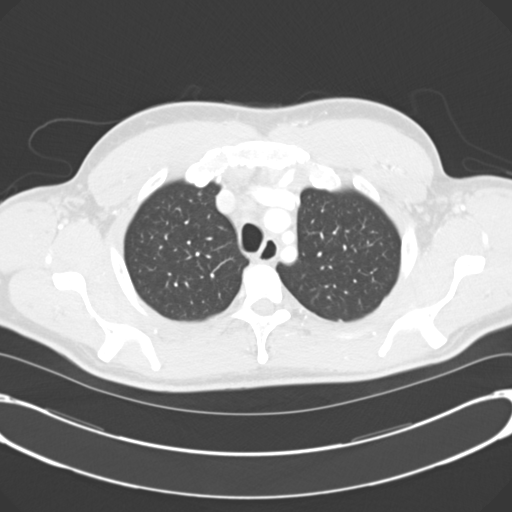
[im 147/165  mediastinal]
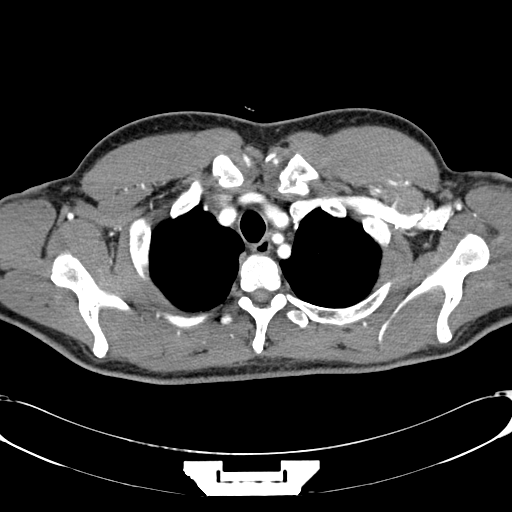
[im 156/165  lung]
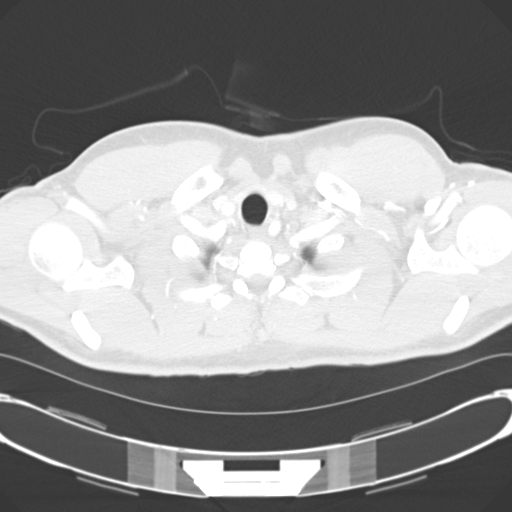

[Series 8: cor arterial mpr · coronal · arterial · 0.64mm/px · 1 of 128 slices shown]
[im 64/128  mediastinal]
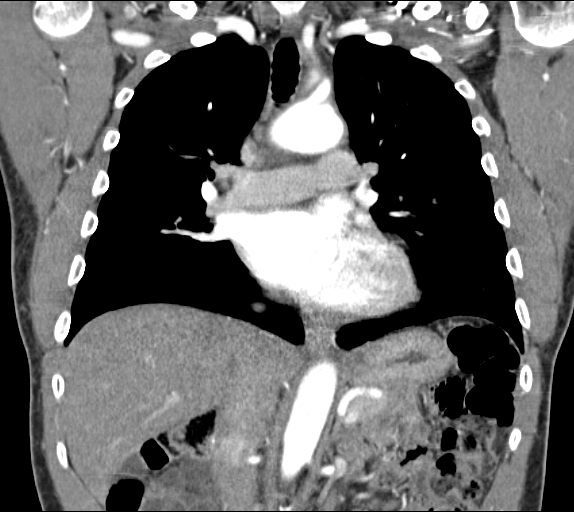

[18 of 36 positions shown; findings below may reference images not displayed]

FINDINGS: A lower attenuation left thyroid nodule is noted and could be
assessed further by ultrasound if warranted clinically. The thoracic
aorta is well opacified. There is no evidence of acute thoracic
aortic dissection. The ascending thoracic aorta at the level of the
main pulmonary artery measures 3.9 cm Recommend annual imaging
followup by CTA or MRA. This recommendation follows 5303
ACCF/AHA/AATS/ACR/ASA/SCA/LOCKLEAR/SHAMEKA/MRZAKI/SEBCIO Guidelines for the
Diagnosis and Management of Patients with Thoracic Aortic Disease.
Circulation.5303; 121: [id number].

The pulmonary arteries are not as well opacified but no acute
abnormality is evident. The heart is mildly enlarged. No pericardial
effusion is seen. Scans through the upper abdomen do show patency of
the celiac axis, SMA, and renal arteries. The previously noted
mediastinal and right hilar nodes have not enlarged. The coronary
arteries are opacified although motion obscures detail.

The previously noted small pulmonary nodules have either resolved or
diminished in size. No enlarging pulmonary nodule is seen. These
most likely were post inflammatory in nature. No parenchymal
infiltrate is seen and there is no evidence of pleural effusion. The
central airway is patent. There are mild degenerative changes in the
lower thoracic spine.

Review of the MIP images confirms the above findings.
IMPRESSION: 1. No evidence of thoracic aortic dissection.
2. The pulmonary arteries are not well opacified but no acute
abnormality is evident.
3. Prominent ascending aorta measuring 3.9 cm. Recommend annual
imaging followup by CTA or MRA. This recommendation follows 5303
ACCF/AHA/AATS/ACR/ASA/SCA/LOCKLEAR/SHAMEKA/MRZAKI/SEBCIO Guidelines for the
Diagnosis and Management of Patients with Thoracic Aortic Disease.
Circulation.5303; 121: [id number]
4. Stable mediastinal and hilar nodes.
5. Small nodules described previously have either resolved or
diminished in size.

## 2017-12-01 ENCOUNTER — Other Ambulatory Visit: Payer: Self-pay | Admitting: Cardiovascular Disease

## 2017-12-03 ENCOUNTER — Other Ambulatory Visit: Payer: Self-pay | Admitting: Cardiovascular Disease

## 2017-12-09 ENCOUNTER — Other Ambulatory Visit: Payer: Self-pay | Admitting: Cardiovascular Disease

## 2017-12-10 ENCOUNTER — Telehealth: Payer: Self-pay | Admitting: Cardiovascular Disease

## 2017-12-10 NOTE — Telephone Encounter (Signed)
Patient notified that he will need to come for his follow up appointment on 12/31/2017.  The patient is aware the Hydralazine and Isosorbide has been refilled.  The patient was instructed to contact or office if he has any further refills before his follow up appointment 9/30.

## 2017-12-10 NOTE — Telephone Encounter (Signed)
Pt states his rx was denied due to him not having an appt, states he has not taken any of his heart medications due to this. Please call to discuss. Pt does have an appt on 9/30

## 2017-12-23 ENCOUNTER — Other Ambulatory Visit: Payer: Self-pay | Admitting: Cardiovascular Disease

## 2017-12-30 NOTE — Progress Notes (Signed)
Cardiology Office Note  Date:  12/31/2017   ID:  Jake Levine, DOB 1960-10-03, MRN [id number]  PCP:  Patient, No Pcp Per   Chief Complaint  Patient presents with  . other    6 month follow up. Meds reviewed by the pt. verbally. "doing well."     HPI:  57 year old male with history of  HTN (untreated for years),  nonischemic cardiomyopathy,  Ejection fraction down to 10-15% in 2016 obstructive sleep apnea, previously on CPAP but reportedly improved with 70 pound weight loss  who presents for follow up of his chronic systolic CHF and hypertensive heart disease.   On good rx, for all of his medications Blood pressure well controlled 1 time ran out of his hydralazine for a week then restarted the hydralazine and blood pressure dropped low for part of the day  Active, regular exercise Does not have insurance Does not have primary care, no recent lab work  Denies having significant  leg edema or abdominal bloating.  Typically when he has fluid overload, he has leg edema, currently this is well controlled.  Occasional snoring but not like it was before, had significant weight loss  Previous studies reviewed with him in detail echo 11/2016 - Left ventricle: The cavity size was normal. There was moderate   concentric hypertrophy. Systolic function was normal. The   estimated ejection fraction was in the range of 50% to 55%. Wall   motion was normal; there were no regional wall motion   abnormalities. Doppler parameters are consistent with abnormal   left ventricular relaxation (grade 1 diastolic dysfunction). - Left atrium: The atrium was mildly dilated. - Right ventricle: Systolic function was normal. - Pulmonary arteries: Systolic pressure was within the normal   range.  EKG personally reviewed by myself on todays visit  shows sinus bradycardia rate 61 bpm, ST and T wave abnormality anterolateral leads  Other past medical history Long history of poorly controlled  hypertension  admitted to [facility] 1/15-1/19/16 with acute systolic CF/dilated cardiomyopathy in the setting of severe hypertensive heart disease. BP was 172/115 upon presentation.  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  Echo on 04/18/14 showed an EF of 10-15%, Wall thickness wasincreased in a pattern of mild LVH. There was moderate concentric hypertrophy.  Unable to exclude RMA. GR3DD. Moderately dilated LA.   He underwent cardiac cath on 1/18 that showed widely patent coronary arteries, elevated LVEDP, known severe dilated cardiomyopathy. It was suspected the patient had severe hypertensive heart disease and medical management was recommended.   PMH:   has a past medical history of Ascending aorta dilatation (HCC), Cardiomegaly, Chronic systolic CHF (congestive heart failure) (HCC), CKD (chronic kidney disease), stage III (HCC), H/O medication noncompliance, Hilar lymphadenopathy, HLD (hyperlipidemia), Hypertension, Hypertensive heart disease, NICM (nonischemic cardiomyopathy) (HCC), and Sleep apnea.  PSH:    Past Surgical History:  Procedure Laterality Date  . FACIAL FRACTURE SURGERY Right ~ 1990  . LEFT HEART CATHETERIZATION WITH CORONARY ANGIOGRAM N/A 04/20/2014   Procedure: LEFT HEART CATHETERIZATION WITH CORONARY ANGIOGRAM;  Surgeon: Micheline Chapman, MD;  Location: [facility] CATH LAB;  Service: Cardiovascular;  Laterality: N/A;  . TUMOR EXCISION Left <2010   "fatty growth"  . VASECTOMY      Current Outpatient Medications  Medication Sig Dispense Refill  . acetaminophen (TYLENOL) 325 MG tablet Take 650 mg by mouth every 6 (six) hours as needed.    Marland Kitchen albuterol (PROVENTIL  HFA;VENTOLIN HFA) 108 (90 BASE) MCG/ACT inhaler Inhale 1-2 puffs into the lungs every 6 (six) hours as needed for wheezing or shortness of breath. Reported on 04/16/2015    . aspirin EC 81 MG tablet Take 81 mg by mouth daily.    .  carvedilol (COREG) 25 MG tablet TAKE 1 TABLET BY MOUTH TWICE DAILY WITH A MEAL 60 tablet 0  . cloNIDine (CATAPRES) 0.1 MG tablet Take 1 tablet (0.1 mg total) by mouth 2 (two) times daily. 180 tablet 3  . furosemide (LASIX) 20 MG tablet TAKE ONE TABLET BY MOUTH ONCE DAILY 90 tablet 3  . hydrALAZINE (APRESOLINE) 100 MG tablet TAKE 1 TABLET BY MOUTH THREE TIMES DAILY 90 tablet 0  . isosorbide mononitrate (IMDUR) 30 MG 24 hr tablet TAKE 1 TABLET BY MOUTH ONCE DAILY 90 tablet 0  . loratadine (CLARITIN) 10 MG tablet Take 10 mg by mouth daily.    Marland Kitchen losartan (COZAAR) 100 MG tablet Take 1 tablet (100 mg total) by mouth daily. 90 tablet 3  . simvastatin (ZOCOR) 20 MG tablet TAKE ONE TABLET BY MOUTH ONCE DAILY AT  6PM 30 tablet 6   No current facility-administered medications for this visit.      Allergies:   Patient has no known allergies.   Social History:  The patient  reports that he has never smoked. He has never used smokeless tobacco. He reports that he drinks about 2.0 standard drinks of alcohol per week. He reports that he does not use drugs.   Family History:   family history includes Alcohol abuse in his maternal grandfather, maternal uncle, and paternal grandmother; Cancer in his father, maternal grandfather, and maternal uncle; Colon cancer in his father; Diabetes in his father; Lupus in his sister; Stroke in his mother.    Review of Systems: Review of Systems  Constitutional: Negative.   Respiratory: Negative.   Cardiovascular: Negative.   Gastrointestinal: Negative.   Musculoskeletal: Negative.   Neurological: Negative.   Psychiatric/Behavioral: Negative.   All other systems reviewed and are negative.    PHYSICAL EXAM: VS:  BP 130/80 (BP Location: Left Arm, Patient Position: Sitting, Cuff Size: Normal)   Pulse 61   Ht 5\' 6"  (1.676 m)   Wt 170 lb 8 oz (77.3 kg)   BMI 27.52 kg/m  , BMI Body mass index is 27.52 kg/m.  GEN: Well nourished, well developed, in no acute  distress  HEENT: normal  Neck: no JVD, carotid bruits, or masses Cardiac: RRR; no murmurs, rubs, or gallops,no edema  Respiratory:  clear to auscultation bilaterally, normal work of breathing GI: soft, nontender, nondistended, + BS MS: no deformity or atrophy  Skin: warm and dry, no rash Neuro:  Strength and sensation are intact Psych: euthymic mood, full affect    Recent Labs: No results found for requested labs within last 8760 hours.    Lipid Panel Lab Results  Component Value Date   CHOL 221 (H) 04/20/2014   HDL 67 04/20/2014   LDLCALC 145 (H) 04/20/2014   TRIG 44 04/20/2014      Wt Readings from Last 3 Encounters:  12/31/17 170 lb 8 oz (77.3 kg)  10/31/16 171 lb (77.6 kg)  02/07/16 176 lb (79.8 kg)       ASSESSMENT AND PLAN:   Accelerated hypertension - Plan: EKG 12-Lead Blood pressure is well controlled on today's visit. No changes made to the medications. Refills provided  Dilated cardiomyopathy (HCC) - Plan: EKG 12-Lead Appears relatively euvolemic Recommended he  try to establish with primary care for routine lab work  Pure hypercholesterolemia Stay on simvastatin Recommend routine lab work through primary care  Chronic systolic CHF (congestive heart failure) (HCC) - Plan: EKG 12-Lead Echocardiogram January 2016 ejection fraction was 10-15% Repeat echocardiogram August 2018 ejection fraction 50 to 55% Appears relatively euvolemic on his current medications Recommended BMP through primary care  OSA (obstructive sleep apnea) - Plan: EKG 12-Lead  symptoms improved after weight loss Continues to maintain his weight No further testing  CKD (chronic kidney disease), stage III Recommended routine follow-up with primary care   Total encounter time more than 25 minutes  Greater than 50% was spent in counseling and coordination of care with the patient   Disposition:   F/U  12 months   No orders of the defined types were placed in this  encounter.    Signed, Dossie Arbour, M.D., Ph.D. 12/31/2017  [facility] [place], [place] [phone]

## 2017-12-31 ENCOUNTER — Ambulatory Visit: Payer: BLUE CROSS/BLUE SHIELD | Admitting: Cardiovascular Disease

## 2017-12-31 ENCOUNTER — Encounter: Payer: Self-pay | Admitting: Cardiovascular Disease

## 2017-12-31 VITALS — BP 130/80 | HR 61 | Ht 66.0 in | Wt 170.5 lb

## 2017-12-31 DIAGNOSIS — I10 Essential (primary) hypertension: Secondary | ICD-10-CM

## 2017-12-31 DIAGNOSIS — I77810 Thoracic aortic ectasia: Secondary | ICD-10-CM

## 2017-12-31 DIAGNOSIS — N183 Chronic kidney disease, stage 3 unspecified: Secondary | ICD-10-CM

## 2017-12-31 DIAGNOSIS — I420 Dilated cardiomyopathy: Secondary | ICD-10-CM

## 2017-12-31 DIAGNOSIS — E7800 Pure hypercholesterolemia, unspecified: Secondary | ICD-10-CM

## 2017-12-31 DIAGNOSIS — G4733 Obstructive sleep apnea (adult) (pediatric): Secondary | ICD-10-CM

## 2017-12-31 DIAGNOSIS — I5022 Chronic systolic (congestive) heart failure: Secondary | ICD-10-CM

## 2017-12-31 MED ORDER — CLONIDINE HCL 0.1 MG PO TABS: 0.1000 mg | ORAL_TABLET | Freq: Two times a day (BID) | ORAL | 3 refills | Status: DC

## 2017-12-31 MED ORDER — FUROSEMIDE 20 MG PO TABS: 20.0000 mg | ORAL_TABLET | Freq: Every day | ORAL | 3 refills | Status: DC

## 2017-12-31 MED ORDER — HYDRALAZINE HCL 100 MG PO TABS: 100.0000 mg | ORAL_TABLET | Freq: Three times a day (TID) | ORAL | 11 refills | Status: DC

## 2017-12-31 MED ORDER — ISOSORBIDE MONONITRATE ER 30 MG PO TB24: 30.0000 mg | ORAL_TABLET | Freq: Every day | ORAL | 3 refills | Status: DC

## 2017-12-31 MED ORDER — LOSARTAN POTASSIUM 100 MG PO TABS: 100.0000 mg | ORAL_TABLET | Freq: Every day | ORAL | 3 refills | Status: DC

## 2017-12-31 MED ORDER — SIMVASTATIN 20 MG PO TABS: ORAL_TABLET | ORAL | 11 refills | Status: DC

## 2017-12-31 MED ORDER — CARVEDILOL 25 MG PO TABS: 25.0000 mg | ORAL_TABLET | Freq: Two times a day (BID) | ORAL | 11 refills | Status: DC

## 2017-12-31 NOTE — Patient Instructions (Addendum)
We will give you info on [facility], [facility]  - Information has been given to you/ the application for Medication Management/ [facility]  [facility]: [phone] [address] [place], [place] [address]  - [facility]: [phone] [address], [place] [address]  Medication Instructions:  Your physician has recommended you make the following change in your medication:   - START potassium OTC (over the counter)- take 1 tablet by mouth once daily  Labwork:  - No new labs needed today  - please establish with a primary care doctor so they may follow up on your lab work  Testing/Procedures:  - No further testing at this time   Follow-Up: It was a pleasure seeing you in the office today. Please call us if you have new issues that need to be addressed before your next appt.  [phone]  Your physician wants you to follow-up in: 12 months.  You will receive a reminder letter in the mail two months in advance. If you don't receive a letter, please call our office to schedule the follow-up appointment.  If you need a refill on your cardiac medications before your next appointment, please call your pharmacy.  For educational health videos Log in to : [email or web address] Or : [email or web address], password : [email or web address]

## 2018-01-01 ENCOUNTER — Other Ambulatory Visit: Payer: Self-pay | Admitting: Cardiovascular Disease

## 2018-01-01 NOTE — Telephone Encounter (Signed)
Please review for refill.  [name] told the patient to start OTC potassium yesterday.  [facility] pharmacy is requesting a refill for Potassium chl SA 20 meq one tablet daily. Is this the correct dosing?

## 2018-01-02 ENCOUNTER — Telehealth: Payer: Self-pay | Admitting: Internal Medicine

## 2018-01-02 MED ORDER — POTASSIUM CHLORIDE CRYS ER 20 MEQ PO TBCR: 20.0000 meq | EXTENDED_RELEASE_TABLET | Freq: Every day | ORAL | 3 refills | Status: DC

## 2018-01-02 NOTE — Telephone Encounter (Signed)
S/w patient. He would like to take the potassium daily.  He would prefer to get his lab work at the [facility] and plans to establish with them. He will call us if he is not able to get the lab work done there.  Rx sent to pharmacy.

## 2018-01-02 NOTE — Telephone Encounter (Signed)
Phone note routed to [name] to review.

## 2018-01-02 NOTE — Telephone Encounter (Signed)
[name],   Jake Levine sent this to triage to look at for this patient that you saw Monday.   Jake Levine, CMA routed conversation to [employer] Triage 21 hours ago (3:35 PM)    Jake Levine, CMA 21 hours ago (3:32 PM)     Please review for refill.  [name] told the patient to start OTC potassium yesterday.  [facility] pharmacy is requesting a refill for Potassium chl SA 20 meq one tablet daily. Is this the correct dosing?           It looks like he has been on RX potassium 20 meq once daily in the past.  Do you still just want him on OTC potassium or since he's had the 20 meq tabs before, do you want him on this?

## 2018-01-02 NOTE — Telephone Encounter (Signed)
Would probably recommend potassium at least 10 if not 20 mEq of potassium We can send this in .  If he would like a prescription would send in for 20 daily  Alternatively he could take 2 of the over-the-counter potassium pills   He does not have insurance and uses good Rx  Ideally would like a basic metabolic panel.   He was going [facility]. BMP can be done through our office if needed but he does not have insurance If he does do blood work would do the BMP after 1 month on the potassium

## 2018-02-20 ENCOUNTER — Telehealth: Payer: Self-pay

## 2018-02-20 NOTE — Telephone Encounter (Signed)
Error

## 2019-01-11 ENCOUNTER — Other Ambulatory Visit: Payer: Self-pay | Admitting: Cardiovascular Disease

## 2019-01-13 NOTE — Telephone Encounter (Signed)
No ans no vm   °

## 2019-01-13 NOTE — Telephone Encounter (Signed)
Pt due for 12 month f/u. Please contact pt for future appointment. 

## 2019-01-14 ENCOUNTER — Other Ambulatory Visit: Payer: Self-pay | Admitting: Cardiovascular Disease

## 2019-01-15 NOTE — Telephone Encounter (Signed)
Patient scheduled .  Patient states he is currently out of meds

## 2019-01-15 NOTE — Telephone Encounter (Signed)
Please schedule overdue F/U with Dr. Gollan. Thank you! 

## 2019-01-21 ENCOUNTER — Other Ambulatory Visit: Payer: Self-pay | Admitting: Cardiovascular Disease

## 2019-01-21 NOTE — Progress Notes (Signed)
Virtual Visit via Video Note   This visit type was conducted due to national recommendations for restrictions regarding the COVID-19 Pandemic (e.g. social distancing) in an effort to limit this patient's exposure and mitigate transmission in our community.  Due to his co-morbid illnesses, this patient is at least at moderate risk for complications without adequate follow up.  This format is felt to be most appropriate for this patient at this time.  All issues noted in this document were discussed and addressed.  A limited physical exam was performed with this format.  Please refer to the patient's chart for his consent to telehealth for [facility].   I connected with  [name] on 01/22/19 by a video enabled telemedicine application and verified that I am speaking with the correct person using two identifiers. I discussed the limitations of evaluation and management by telemedicine. The patient expressed understanding and agreed to proceed.   Evaluation Performed:  Follow-up visit  Date:  01/22/2019   ID:  Jake Levine, DOB February 27, 1961, MRN [id number]  Patient Location:  [address] [place] [address]   Provider location:   [facility], [place] office  PCP:  Patient, No Pcp Per  Cardiologist:  Patsy Baltimore  Chief Complaint:  NICM   History of Present Illness:    Jake Levine is a 58 y.o. male who presents via audio/video conferencing for a telehealth visit today.   The patient does not symptoms concerning for COVID-19 infection (fever, chills, cough, or new SHORTNESS OF BREATH).   Patient has a past medical history of HTN (untreated for years),  nonischemic cardiomyopathy,  Ejection fraction down to 10-15% in 2016 obstructive sleep apnea, previously on CPAP but reportedly improved with 70 pound weight loss  Ejection fraction 50 to 55% in August 2018 who presents for follow up of his chronic systolic CHF and hypertensive heart  disease.   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 There was moderate concentric hypertrophy. Systolic function was normal. The estimated ejection fraction was in the range of 50% to 55%. Wall motion was normal; there were no regional wall motion abnormalities. Doppler parameters are consistent with abnormal left ventricular relaxation (grade 1 diastolic dysfunction). - Left atrium: The atrium was mildly dilated. - Right ventricle: Systolic function was normal. - Pulmonary arteries: Systolic pressure was within the normal range.   Other past medical history Long history of poorly controlled hypertension admitted to [facility] 1/15-1/19/16 with acute systolic CF/dilated cardiomyopathy in the setting of severe hypertensive heart disease. BP was 172/115 upon presentation.  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 Echo on 04/18/14 showed an EF of 10-15%, Wall thickness wasincreased in a pattern of mild LVH. There was moderate concentric hypertrophy. Unable to exclude RMA. GR3DD. Moderately dilated LA.  He underwent cardiac cath on 1/18 that showed widely patent coronary arteries, elevated LVEDP, known severe dilated cardiomyopathy. It was suspected the patient had severe hypertensive heart disease and medical management was recommended.    Prior CV  studies:   The following studies were reviewed today:   Past Medical History:  Diagnosis Date  . Ascending aorta dilatation  (HCC)    a. CTA 09/30/2014: prominent ascending ascending aorta measuring 3.9 cm, recommended annual imaging follow up by CTA or MRA  . Cardiomegaly   . Chronic systolic CHF (congestive heart failure) (HCC)    a. echo 04/2014: EF 10-15%, mild LVH, mod concentrtic hypertrophy,  RWMA cannot be excluded, GR3DD, left atrium mod dilated at 53 mm  . CKD (chronic kidney disease), stage III    a. baseline SCr 1.3  . H/O medication noncompliance   . Hilar lymphadenopathy    a. suggestive of sarcoidosis  . HLD (hyperlipidemia)   . Hypertension   . Hypertensive heart disease   . NICM (nonischemic cardiomyopathy) (HCC)    a. cath 04/2014: widely patent coronary arteries, elevted LVEDP, known severe dilated CM  . Sleep apnea    "lost 75#; no need for mask anymore" (04/18/14)   Past Surgical History:  Procedure Laterality Date  . FACIAL FRACTURE SURGERY Right ~ 1990  . LEFT HEART CATHETERIZATION WITH CORONARY ANGIOGRAM N/A 04/20/2014   Procedure: LEFT HEART CATHETERIZATION WITH CORONARY ANGIOGRAM;  Surgeon: [name], MD;  [facility];  Service: Cardiovascular;  Laterality: N/A;  . TUMOR EXCISION Left <2010   "fatty growth"  . VASECTOMY      Allergies:   Patient has no known allergies.   Social History   Tobacco Use  . Smoking status: Never Smoker  . Smokeless tobacco: Never Used  Substance Use Topics  . Alcohol use: Yes    Alcohol/week: 2.0 standard drinks    Types: 2 Glasses of wine per week  . Drug use: No     Current Outpatient Medications on File Prior to Visit  Medication Sig Dispense Refill  . acetaminophen (TYLENOL) 325 MG tablet Take 650 mg by mouth every 6 (six) hours as needed.    Marland Kitchen. albuterol (PROVENTIL HFA;VENTOLIN HFA) 108 (90 BASE) MCG/ACT inhaler Inhale 1-2 puffs into the lungs every 6 (six) hours as needed for wheezing or shortness of breath. Reported on 04/16/2015    . aspirin EC 81 MG tablet Take 81 mg by mouth daily.    . carvedilol (COREG) 25 MG tablet  Take 1 tablet (25 mg total) by mouth 2 (two) times daily with a meal. 60 tablet 11  . cloNIDine (CATAPRES) 0.1 MG tablet Take 1 tablet by mouth twice daily 180 tablet 0  . furosemide (LASIX) 20 MG tablet Take 1 tablet by mouth once daily 90 tablet 0  . hydrALAZINE (APRESOLINE) 100 MG tablet Take 1 tablet (100 mg total) by mouth 3 (three) times daily. 90 tablet 11  . isosorbide mononitrate (IMDUR) 30 MG 24 hr tablet Take 1 tablet (30 mg total) by mouth daily. 90 tablet 3  . loratadine (CLARITIN) 10 MG tablet Take 10 mg by mouth daily.    Marland Kitchen. losartan (COZAAR) 100 MG tablet Take 1 tablet (100 mg total) by mouth daily. 90 tablet 3  . potassium chloride SA (K-DUR,KLOR-CON) 20 MEQ tablet Take 1 tablet (20 mEq total) by mouth daily. 90 tablet 3  . simvastatin (ZOCOR) 20 MG tablet TAKE 1 TABLET BY MOUTH ONCE DAILY AT  6PM 90 tablet 0   No current facility-administered medications on file prior to visit.      Family Hx: The patient's family history includes Alcohol abuse in his maternal grandfather, maternal uncle, and paternal grandmother; Cancer in  his father, maternal grandfather, and maternal uncle; Colon cancer in his father; Diabetes in his father; Lupus in his sister; Stroke in his mother.  ROS:   Please see the history of present illness.    Review of Systems  Constitutional: Negative.   HENT: Negative.   Respiratory: Negative.   Cardiovascular: Negative.   Gastrointestinal: Negative.   Musculoskeletal: Negative.   Neurological: Negative.   Psychiatric/Behavioral: Negative.   All other systems reviewed and are negative.    Labs/Other Tests and Data Reviewed:    Recent Labs: No results found for requested labs within last 8760 hours.   Recent Lipid Panel Lab Results  Component Value Date/Time   CHOL 221 (H) 04/20/2014 01:28 AM   TRIG 44 04/20/2014 01:28 AM   HDL 67 04/20/2014 01:28 AM   CHOLHDL 3.3 04/20/2014 01:28 AM   LDLCALC 145 (H) 04/20/2014 01:28 AM    Wt Readings from  Last 3 Encounters:  01/22/19 167 lb (75.8 kg)  12/31/17 170 lb 8 oz (77.3 kg)  10/31/16 171 lb (77.6 kg)     Exam:    Vital Signs: Vital signs may also be detailed in the HPI Ht  (1.676 m)   Wt 167 lb (75.8 kg)   BMI 26.95 kg/m   Wt Readings from Last 3 Encounters:  01/22/19 167 lb (75.8 kg)  12/31/17 170 lb 8 oz (77.3 kg)  10/31/16 171 lb (77.6 kg)   Temp Readings from Last 3 Encounters:  04/16/15 97.4 F (36.3 C) (Oral)  03/18/15 98 F (36.7 C) (Oral)  04/21/14 98.3 F (36.8 C) (Oral)   BP Readings from Last 3 Encounters:  12/31/17 130/80  10/31/16 (!) 160/100  02/07/16 (!) 168/90   Pulse Readings from Last 3 Encounters:  12/31/17 61  10/31/16 (!) 56  02/07/16 (!) 56     Well nourished, well developed male in no acute distress. Constitutional:  oriented to person, place, and time. No distress.    ASSESSMENT & PLAN:    Problem List Items Addressed This Visit      Cardiology Problems   HLD (hyperlipidemia)   Chronic systolic CHF (congestive heart failure) (HCC) - Primary   Ascending aorta dilatation (HCC)   Cardiomyopathy- EF 10-1%- etiology not yet determined   Accelerated hypertension     Other   OSA (obstructive sleep apnea)   CKD (chronic kidney disease), stage III     Accelerated hypertension - Continue current medications as listed above, he will check blood pressure and call our office  Dilated cardiomyopathy (HCC) - Plan: EKG 12-Lead Ejection fraction improved in 2018, By his details sounds relatively stable No changes to medications He will check blood pressure and call our office  Pure hypercholesterolemia Stay on simvastatin Lab work has been ordered, we will mail him a lab slip  Chronic systolic CHF (congestive heart failure) (HCC) - Plan: EKG 12-Lead Echocardiogram January 2016 ejection fraction was 10-15% Repeat echocardiogram August 2018 ejection fraction 50 to 55% We will order lab work especially as he is on a statin,  Lasix with potassium  OSA (obstructive sleep apnea) -  Not on CPAP, "dont need it anymore after weight loss"  CKD (chronic kidney disease), stage III  we have ordered lab work   COVID-19 Education: The signs and symptoms of COVID-19 were discussed with the patient and how to seek care for testing (follow up with PCP or arrange E-visit).  The importance of social distancing was discussed today.  Patient Risk:  After full review of this patients clinical status, I feel that they are at least moderate risk at this time.  Time:   Today, I have spent 25 minutes with the patient with telehealth technology discussing the cardiac and medical problems/diagnoses detailed above   Additional 10 min spent reviewing the chart prior to patient visit today   Medication Adjustments/Labs and Tests Ordered: Current medicines are reviewed at length with the patient today.  Concerns regarding medicines are outlined above.   Tests Ordered: No tests ordered   Medication Changes: No changes made   Disposition: Follow-up in 12 months   Signed, Julien Nordmann, MD  [facility] [address], [place], [place] [address]

## 2019-01-22 ENCOUNTER — Telehealth (INDEPENDENT_AMBULATORY_CARE_PROVIDER_SITE_OTHER): Payer: Self-pay | Admitting: Cardiovascular Disease

## 2019-01-22 ENCOUNTER — Encounter: Payer: Self-pay | Admitting: Cardiovascular Disease

## 2019-01-22 VITALS — Ht 66.0 in | Wt 167.0 lb

## 2019-01-22 DIAGNOSIS — I77810 Thoracic aortic ectasia: Secondary | ICD-10-CM

## 2019-01-22 DIAGNOSIS — I5022 Chronic systolic (congestive) heart failure: Secondary | ICD-10-CM

## 2019-01-22 DIAGNOSIS — I10 Essential (primary) hypertension: Secondary | ICD-10-CM

## 2019-01-22 DIAGNOSIS — N1831 Chronic kidney disease, stage 3a: Secondary | ICD-10-CM

## 2019-01-22 DIAGNOSIS — E7800 Pure hypercholesterolemia, unspecified: Secondary | ICD-10-CM

## 2019-01-22 DIAGNOSIS — I420 Dilated cardiomyopathy: Secondary | ICD-10-CM

## 2019-01-22 DIAGNOSIS — G4733 Obstructive sleep apnea (adult) (pediatric): Secondary | ICD-10-CM

## 2019-01-22 MED ORDER — ISOSORBIDE MONONITRATE ER 30 MG PO TB24: 30.0000 mg | ORAL_TABLET | Freq: Every day | ORAL | 3 refills | Status: DC

## 2019-01-22 MED ORDER — CARVEDILOL 25 MG PO TABS: 25.0000 mg | ORAL_TABLET | Freq: Two times a day (BID) | ORAL | 3 refills | Status: DC

## 2019-01-22 MED ORDER — HYDRALAZINE HCL 100 MG PO TABS: 100.0000 mg | ORAL_TABLET | Freq: Three times a day (TID) | ORAL | 3 refills | Status: DC

## 2019-01-22 MED ORDER — LOSARTAN POTASSIUM 100 MG PO TABS: 100.0000 mg | ORAL_TABLET | Freq: Every day | ORAL | 3 refills | Status: DC

## 2019-01-22 MED ORDER — POTASSIUM CHLORIDE CRYS ER 20 MEQ PO TBCR: 20.0000 meq | EXTENDED_RELEASE_TABLET | Freq: Every day | ORAL | 3 refills | Status: DC

## 2019-01-22 MED ORDER — SIMVASTATIN 20 MG PO TABS: ORAL_TABLET | ORAL | 3 refills | Status: DC

## 2019-01-22 MED ORDER — FUROSEMIDE 20 MG PO TABS: 20.0000 mg | ORAL_TABLET | Freq: Every day | ORAL | 3 refills | Status: DC

## 2019-01-22 NOTE — Patient Instructions (Addendum)
Needs the [facility] number for primary care Also the [facility] as he has no primary care physician He is self-pay   Mail lab slip to [facility] for CMP, lipids  Medication Instructions:  No changes Refill medications 90 days with refills  If you need a refill on your cardiac medications before your next appointment, please call your pharmacy.    Lab work: Lab slips for fasting labwork have been mailed to your home. Pleas take them with you to your local [facility] to have them completed.   If you have labs (blood work) drawn today and your tests are completely normal, you will receive your results only by: Marland Kitchen MyChart Message (if you have MyChart) OR . A paper copy in the mail If you have any lab test that is abnormal or we need to change your treatment, we will call you to review the results.   Testing/Procedures: No new testing needed   Follow-Up: At [facility], you and your health needs are our priority.  As part of our continuing mission to provide you with exceptional heart care, we have created designated Provider Care Teams.  These Care Teams include your primary Cardiologist (physician) and Advanced Practice Providers (APPs -  Physician Assistants and Nurse Practitioners) who all work together to provide you with the care you need, when you need it.  . You will need a follow up appointment in 12 months .   Please call our office 2 months in advance to schedule this appointment.    . Providers on your designated Care Team:   . Murray Hodgkins, NP . Christell Faith, PA-C . Marrianne Mood, PA-C  Any Other Special Instructions Will Be Listed Below (If Applicable).  Please contact [facility] ([facility]) they will help you to connect with a primary care physician. [phone].  [facility] [place]: [phone] provides healthcare services for under insured and uninsured patients.  For educational health videos Log in to : [email or web address] Or :  [email or web address], password : [email or web address]

## 2019-01-31 ENCOUNTER — Telehealth: Payer: Self-pay | Admitting: Cardiovascular Disease

## 2019-01-31 NOTE — Telephone Encounter (Signed)
I spoke with the patient to confirm his meds/ times of day he is taking these.  Per the patient he takes: - coreg 25 mg BID (~ 7am & before bed) - clonidine 0.1 mg BID (~ 7am & before bed) -  Lasix 20 mg QD (not clarified time of day) - hydralazine 100 mg TID ( ~ 7am/ 12 pm/ bedtime) - imdur 30 mg QD (before bed) - losartan 100 mg QD (before bed)  I asked if he had any other BP readings aside from the ones reported on 01/29/19. He states he does, but is not home now and does not have readings in front of him.  He states he will check his BP Saturday/ Sunday and call back Monday with some additional readings.  Will go ahead and forward to Dr. Rockey Situ to review.

## 2019-01-31 NOTE — Telephone Encounter (Signed)
Patient calling  Patient had a tele visit with Dr Rockey Situ on 10/21 Calling with recent BP readings 10/28 7:45a 137/74 pulse 59 1:45p 165/87 pulse 70 7:04p 165/98 pulse 58 Patient can tell when it is high, usually feels sluggish

## 2019-02-02 NOTE — Telephone Encounter (Signed)
We could increase Imdur up to 30 mg twice daily P.m. dose seems to be wearing off by the next afternoon

## 2019-02-03 MED ORDER — ISOSORBIDE MONONITRATE ER 30 MG PO TB24: ORAL_TABLET | ORAL | 3 refills | Status: DC

## 2019-02-03 NOTE — Telephone Encounter (Signed)
I called and spoke with the patient regarding Dr. Donivan Scull recommendations to increase imdur to 30 mg BID.  The patient voices understanding and is agreeable.  He did record BP readings this weekend:  02/01/19- (meds taken at 0925/ 1305/ 1900) 0827- 203/117 (63) 0946- 137/82 (60) 1200- 160/89 (58) 1900- 198/115 (60)  02/02/19- 0852- 171/100 (44) 1308- 178/92 (49) 2017- 184/110 (68)  02/03/19- (meds taken at 0740/ 1200) 0729- 168/109 (52) 1321- 161/92 (59)  I have advised the patient to continue to record his BP readings BID- once in the AM & once in the PM (about 1 hour after meds both times) and call with updated BP readings on higher dose imdur.   The patient is agreeable.

## 2019-02-04 LAB — LIPID PANEL
Chol/HDL Ratio: 2.4 ratio (ref 0.0–5.0)
Cholesterol, Total: 162 mg/dL (ref 100–199)
HDL: 68 mg/dL (ref >39)
LDL Chol Calc (NIH): 81 mg/dL (ref 0–99)
Triglycerides: 64 mg/dL (ref 0–149)
VLDL Cholesterol Cal: 13 mg/dL (ref 5–40)

## 2019-02-04 LAB — HEPATIC FUNCTION PANEL
ALT: 15 IU/L (ref 0–44)
AST: 13 IU/L (ref 0–40)
Albumin: 4.2 g/dL (ref 3.8–4.9)
Alkaline Phosphatase: 63 IU/L (ref 39–117)
Bilirubin Total: 0.6 mg/dL (ref 0.0–1.2)
Bilirubin, Direct: 0.19 mg/dL (ref 0.00–0.40)
Total Protein: 6.2 g/dL (ref 6.0–8.5)

## 2019-02-10 ENCOUNTER — Telehealth: Payer: Self-pay | Admitting: Cardiovascular Disease

## 2019-02-10 NOTE — Telephone Encounter (Signed)
Patient calling with BP readings 02/03/19 - (meds 0740 1200 1820) 0730 168/109 pulse 52 1210 161/92 pulse 59 1947 175/93 pulse 56  02/04/19 - (meds 0700 and 2200) 1937 191/112 pulse 77  02/05/19 - (meds 0710 1300 1850) 0900 146/88 pulse 57 1941 187/119 pulse 66  02/06/19 - (meds 0640 1200 1830) 0945 161/100 pulse 57 2016 164/103 pulse 66  02/07/19 - (meds 0650 1200 1903) 0902 170/104 pulse 59  02/08/19 - (meds 0900 1500 2200) 2229 212/116 pulse 56  02/09/19 - (meds 0900 1400 1800) 1137 144/89 pulse 65 1907 201/127 pulse 65  02/10/19 - (meds 0650) 1000 159/91 pulse 49  Patient feels medication is not working Please call to discuss

## 2019-02-12 NOTE — Patient Instructions (Signed)
How to Take Your Blood Pressure You can take your blood pressure at home with a machine. You may need to check your blood pressure at home:  To check if you have high blood pressure (hypertension).  To check your blood pressure over time.  To make sure your blood pressure medicine is working. Supplies needed: You will need a blood pressure machine, or monitor. You can buy one at a drugstore or online. When choosing one:  Choose one with an arm cuff.  Choose one that wraps around your upper arm. Only one finger should fit between your arm and the cuff.  Do not choose one that measures your blood pressure from your wrist or finger. Your doctor can suggest a monitor. How to prepare Avoid these things for 30 minutes before checking your blood pressure:  Drinking caffeine.  Drinking alcohol.  Eating.  Smoking.  Exercising. Five minutes before checking your blood pressure:  Pee.  Sit in a dining chair. Avoid sitting in a soft couch or armchair.  Be quiet. Do not talk. How to take your blood pressure Follow the instructions that came with your machine. If you have a digital blood pressure monitor, these may be the instructions: 1. Sit up straight. 2. Place your feet on the floor. Do not cross your ankles or legs. 3. Rest your left arm at the level of your heart. You may rest it on a table, desk, or chair. 4. Pull up your shirt sleeve. 5. Wrap the blood pressure cuff around the upper part of your left arm. The cuff should be 1 inch (2.5 cm) above your elbow. It is best to wrap the cuff around bare skin. 6. Fit the cuff snugly around your arm. You should be able to place only one finger between the cuff and your arm. 7. Put the cord inside the groove of your elbow. 8. Press the power button. 9. Sit quietly while the cuff fills with air and loses air. 10. Write down the numbers on the screen. 11. Wait 2-3 minutes and then repeat steps 1-10. What do the numbers mean? Two  numbers make up your blood pressure. The first number is called systolic pressure. The second is called diastolic pressure. An example of a blood pressure reading is "120 over 80" (or 120/80). If you are an adult and do not have a medical condition, use this guide to find out if your blood pressure is normal: Normal  First number: below 120.  Second number: below 80. Elevated  First number: 120-129.  Second number: below 80. Hypertension stage 1  First number: 130-139.  Second number: 80-89. Hypertension stage 2  First number: 140 or above.  Second number: 90 or above. Your blood pressure is above normal even if only the top or bottom number is above normal. Follow these instructions at home:  Check your blood pressure as often as your doctor tells you to.  Take your monitor to your next doctor's appointment. Your doctor will: ? Make sure you are using it correctly. ? Make sure it is working right.  Make sure you understand what your blood pressure numbers should be.  Tell your doctor if your medicines are causing side effects. Contact a doctor if:  Your blood pressure keeps being high. Get help right away if:  Your first blood pressure number is higher than 180.  Your second blood pressure number is higher than 120. This information is not intended to replace advice given to you by your health   care provider. Make sure you discuss any questions you have with your health care provider. Document Released: 03/02/2008 Document Revised: 03/02/2017 Document Reviewed: 08/27/2015 Elsevier Patient Education  2020 [employer]  How to Take Your Blood Pressure You can take your blood pressure at home with a machine. You may need to check your blood pressure at home:  To check if you have high blood pressure (hypertension).  To check your blood pressure over time.  To make sure your blood pressure medicine is working. Supplies needed: You will need a blood pressure  machine, or monitor. You can buy one at a drugstore or online. When choosing one:  Choose one with an arm cuff.  Choose one that wraps around your upper arm. Only one finger should fit between your arm and the cuff.  Do not choose one that measures your blood pressure from your wrist or finger. Your doctor can suggest a monitor. How to prepare Avoid these things for 30 minutes before checking your blood pressure:  Drinking caffeine.  Drinking alcohol.  Eating.  Smoking.  Exercising. Five minutes before checking your blood pressure:  Pee.  Sit in a dining chair. Avoid sitting in a soft couch or armchair.  Be quiet. Do not talk. How to take your blood pressure Follow the instructions that came with your machine. If you have a digital blood pressure monitor, these may be the instructions: 12. Sit up straight. 13. Place your feet on the floor. Do not cross your ankles or legs. 14. Rest your left arm at the level of your heart. You may rest it on a table, desk, or chair. 15. Pull up your shirt sleeve. 16. Wrap the blood pressure cuff around the upper part of your left arm. The cuff should be 1 inch (2.5 cm) above your elbow. It is best to wrap the cuff around bare skin. 17. Fit the cuff snugly around your arm. You should be able to place only one finger between the cuff and your arm. 18. Put the cord inside the groove of your elbow. 19. Press the power button. 20. Sit quietly while the cuff fills with air and loses air. 21. Write down the numbers on the screen. 22. Wait 2-3 minutes and then repeat steps 1-10. What do the numbers mean? Two numbers make up your blood pressure. The first number is called systolic pressure. The second is called diastolic pressure. An example of a blood pressure reading is "120 over 80" (or 120/80). If you are an adult and do not have a medical condition, use this guide to find out if your blood pressure is normal: Normal  First number: below  120.  Second number: below 80. Elevated  First number: 120-129.  Second number: below 80. Hypertension stage 1  First number: 130-139.  Second number: 80-89. Hypertension stage 2  First number: 140 or above.  Second number: 90 or above. Your blood pressure is above normal even if only the top or bottom number is above normal. Follow these instructions at home:  Check your blood pressure as often as your doctor tells you to.  Take your monitor to your next doctor's appointment. Your doctor will: ? Make sure you are using it correctly. ? Make sure it is working right.  Make sure you understand what your blood pressure numbers should be.  Tell your doctor if your medicines are causing side effects. Contact a doctor if:  Your blood pressure keeps being high. Get help right away if:    Your first blood pressure number is higher than 180.  Your second blood pressure number is higher than 120. This information is not intended to replace advice given to you by your health care provider. Make sure you discuss any questions you have with your health care provider. Document Released: 03/02/2008 Document Revised: 03/02/2017 Document Reviewed: 08/27/2015 Elsevier Patient Education  2020 [employer]  

## 2019-02-14 ENCOUNTER — Other Ambulatory Visit: Payer: Self-pay | Admitting: Cardiovascular Disease

## 2019-02-14 MED ORDER — ISOSORBIDE MONONITRATE ER 60 MG PO TB24: ORAL_TABLET | ORAL | 3 refills | Status: DC

## 2019-02-14 NOTE — Telephone Encounter (Signed)
*  I have sent in a prescription for increase in Imdur from 30 twice daily up to 60 twice daily We may need to increase clonidine Additional option would be increased dose and hydralazine

## 2019-02-24 NOTE — Telephone Encounter (Signed)
Patient is calling regarding his BP readings. He has not heard anything, please call to discuss.

## 2019-02-24 NOTE — Telephone Encounter (Signed)
Spoke with patient and his blood pressure this morning was 200/115. Reviewed previous recommendations to increase isosorbide to 60 mg twice a day. He was unsure of what dosage he had at home. I will call pharmacy to see what they have on file for patient. [place] confirmed that they have Isosorbide mononitrate 60 mg with instructions to take twice a day. Will reach back out to patient.

## 2019-02-24 NOTE — Telephone Encounter (Signed)
Spoke with patient and he reports that he was taking isosorbide mononitrate 30 mg twice a day thinking it was the same and new prescription. Advised that he should be taking 60 mg twice a day. He did not realize this was not as prescribed and will increase medication and continue monitoring blood pressures. Advised to please call us back if his blood pressures consistently remain elevated. He verbalized understanding with no further questions at this time.

## 2019-05-11 NOTE — Telephone Encounter (Signed)
Can we get more numbers? Is he taking after this AM meds? Thx TGollan

## 2019-05-18 NOTE — Telephone Encounter (Signed)
Would increase imdur up to 60 BID (week one) Clonidine up to 0.2 BID (week two) Monitor pressures He can do each step one week at a time

## 2019-05-19 ENCOUNTER — Other Ambulatory Visit: Payer: Self-pay | Admitting: *Deleted

## 2019-05-19 MED ORDER — CLONIDINE HCL 0.2 MG PO TABS: 0.2000 mg | ORAL_TABLET | Freq: Two times a day (BID) | ORAL | 3 refills | Status: DC

## 2019-05-19 MED ORDER — ISOSORBIDE MONONITRATE ER 60 MG PO TB24: 60.0000 mg | ORAL_TABLET | Freq: Two times a day (BID) | ORAL | 3 refills | Status: DC

## 2019-06-02 NOTE — Telephone Encounter (Signed)
BP numbers getting close, Would try clonidine 3 mg twice a day or 2 mg TID

## 2019-06-05 MED ORDER — CLONIDINE HCL 0.3 MG PO TABS: 0.3000 mg | ORAL_TABLET | Freq: Two times a day (BID) | ORAL | 3 refills | Status: DC

## 2019-06-25 NOTE — Telephone Encounter (Signed)
Numbers are getting better, Still little high at times Would suggest imdur 90 in the pm (maybe move to QHS) Stay on 60 in the AM This might help AM numbers

## 2019-06-26 ENCOUNTER — Other Ambulatory Visit: Payer: Self-pay | Admitting: *Deleted

## 2019-06-26 MED ORDER — ISOSORBIDE MONONITRATE ER 30 MG PO TB24: 30.0000 mg | ORAL_TABLET | Freq: Every day | ORAL | 3 refills | Status: DC

## 2019-11-18 ENCOUNTER — Telehealth: Payer: Self-pay | Admitting: Cardiovascular Disease

## 2019-11-18 NOTE — Telephone Encounter (Signed)
Patient requesting 23 m fu as virtual   Denies issues or concerns at this time   Ok to schedule ?

## 2019-11-18 NOTE — Telephone Encounter (Signed)
Attempted to schedule.  LMOV to call office.  ° °

## 2019-11-19 ENCOUNTER — Telehealth: Payer: Self-pay | Admitting: Cardiovascular Disease

## 2019-11-19 NOTE — Telephone Encounter (Signed)
  Patient Consent for Virtual Visit         Jake Levine has provided verbal consent on 11/19/2019 for a virtual visit (video or telephone).   CONSENT FOR VIRTUAL VISIT FOR:  Jake Levine  By participating in this virtual visit I agree to the following:  I hereby voluntarily request, consent and authorize [facility] and its employed or contracted physicians, physician assistants, nurse practitioners or other licensed health care professionals (the Practitioner), to provide me with telemedicine health care services (the "Services") as deemed necessary by the treating Practitioner. I acknowledge and consent to receive the Services by the Practitioner via telemedicine. I understand that the telemedicine visit will involve communicating with the Practitioner through live audiovisual communication technology and the disclosure of certain medical information by electronic transmission. I acknowledge that I have been given the opportunity to request an in-person assessment or other available alternative prior to the telemedicine visit and am voluntarily participating in the telemedicine visit.  I understand that I have the right to withhold or withdraw my consent to the use of telemedicine in the course of my care at any time, without affecting my right to future care or treatment, and that the Practitioner or I may terminate the telemedicine visit at any time. I understand that I have the right to inspect all information obtained and/or recorded in the course of the telemedicine visit and may receive copies of available information for a reasonable fee.  I understand that some of the potential risks of receiving the Services via telemedicine include:  Marland Kitchen Delay or interruption in medical evaluation due to technological equipment failure or disruption; . Information transmitted may not be sufficient (e.g. poor resolution of images) to allow for appropriate medical decision making by the  Practitioner; and/or  . In rare instances, security protocols could fail, causing a breach of personal health information.  Furthermore, I acknowledge that it is my responsibility to provide information about my medical history, conditions and care that is complete and accurate to the best of my ability. I acknowledge that Practitioner's advice, recommendations, and/or decision may be based on factors not within their control, such as incomplete or inaccurate data provided by me or distortions of diagnostic images or specimens that may result from electronic transmissions. I understand that the practice of medicine is not an exact science and that Practitioner makes no warranties or guarantees regarding treatment outcomes. I acknowledge that a copy of this consent can be made available to me via my patient portal [facility] MyChart), or I can request a printed copy by calling the office of [facility].    I understand that my insurance will be billed for this visit.   I have read or had this consent read to me. . I understand the contents of this consent, which adequately explains the benefits and risks of the Services being provided via telemedicine.  . I have been provided ample opportunity to ask questions regarding this consent and the Services and have had my questions answered to my satisfaction. . I give my informed consent for the services to be provided through the use of telemedicine in my medical care

## 2019-12-10 ENCOUNTER — Telehealth: Payer: Self-pay | Admitting: Cardiovascular Disease

## 2019-12-17 ENCOUNTER — Other Ambulatory Visit: Payer: Self-pay | Admitting: Cardiovascular Disease

## 2019-12-19 ENCOUNTER — Other Ambulatory Visit: Payer: Self-pay | Admitting: Cardiovascular Disease

## 2019-12-19 NOTE — Telephone Encounter (Signed)
*  STAT* If patient is at the pharmacy, call can be transferred to refill team.   1. Which medications need to be refilled? (please list name of each medication and dose if known)   Clonidine 0.3 mg po BID  2. Which pharmacy/location (including street and city if local pharmacy) is medication to be sent to?  [facility] [address] [place]   3. Do they need a 30 day or 90 day supply? 90

## 2020-01-21 ENCOUNTER — Telehealth (INDEPENDENT_AMBULATORY_CARE_PROVIDER_SITE_OTHER): Payer: Self-pay | Admitting: Cardiovascular Disease

## 2020-01-21 ENCOUNTER — Encounter: Payer: Self-pay | Admitting: Cardiovascular Disease

## 2020-01-21 VITALS — BP 160/90 | HR 60 | Ht 66.0 in | Wt 156.0 lb

## 2020-01-21 DIAGNOSIS — I5022 Chronic systolic (congestive) heart failure: Secondary | ICD-10-CM

## 2020-01-21 DIAGNOSIS — E782 Mixed hyperlipidemia: Secondary | ICD-10-CM

## 2020-01-21 DIAGNOSIS — I77810 Thoracic aortic ectasia: Secondary | ICD-10-CM

## 2020-01-21 DIAGNOSIS — I420 Dilated cardiomyopathy: Secondary | ICD-10-CM

## 2020-01-21 MED ORDER — LOSARTAN POTASSIUM 100 MG PO TABS
100.0000 mg | ORAL_TABLET | Freq: Every day | ORAL | 3 refills | Status: DC
Start: 2020-01-21 — End: 2021-01-21

## 2020-01-21 MED ORDER — POTASSIUM CHLORIDE CRYS ER 20 MEQ PO TBCR: 20.0000 meq | EXTENDED_RELEASE_TABLET | Freq: Every day | ORAL | 3 refills | Status: DC

## 2020-01-21 MED ORDER — CARVEDILOL 25 MG PO TABS: 25.0000 mg | ORAL_TABLET | Freq: Two times a day (BID) | ORAL | 3 refills | Status: DC

## 2020-01-21 MED ORDER — SIMVASTATIN 20 MG PO TABS: ORAL_TABLET | ORAL | 3 refills | Status: DC

## 2020-01-21 MED ORDER — FUROSEMIDE 20 MG PO TABS
20.0000 mg | ORAL_TABLET | Freq: Every day | ORAL | 3 refills | Status: DC
Start: 2020-01-21 — End: 2021-01-21

## 2020-01-21 MED ORDER — CLONIDINE HCL 0.3 MG PO TABS
0.3000 mg | ORAL_TABLET | Freq: Two times a day (BID) | ORAL | 3 refills | Status: DC
Start: 2020-01-21 — End: 2021-01-21

## 2020-01-21 MED ORDER — HYDRALAZINE HCL 100 MG PO TABS: 100.0000 mg | ORAL_TABLET | Freq: Three times a day (TID) | ORAL | 3 refills | Status: DC

## 2020-01-21 MED ORDER — ISOSORBIDE MONONITRATE ER 30 MG PO TB24: 30.0000 mg | ORAL_TABLET | Freq: Every day | ORAL | 3 refills | Status: DC

## 2020-01-21 MED ORDER — ISOSORBIDE MONONITRATE ER 60 MG PO TB24: 60.0000 mg | ORAL_TABLET | Freq: Two times a day (BID) | ORAL | 3 refills | Status: DC

## 2020-01-21 NOTE — Progress Notes (Signed)
Virtual Visit via Video Note   This visit type was conducted due to national recommendations for restrictions regarding the COVID-19 Pandemic (e.g. social distancing) in an effort to limit this patient's exposure and mitigate transmission in our community.  Due to his co-morbid illnesses, this patient is at least at moderate risk for complications without adequate follow up.  This format is felt to be most appropriate for this patient at this time.  All issues noted in this document were discussed and addressed.  A limited physical exam was performed with this format.  Please refer to the patient's chart for his consent to telehealth for Jake Levine.   I connected with  Jake Levine on 01/21/20 by a video enabled telemedicine application and verified that I am speaking with the correct person using two identifiers. I discussed the limitations of evaluation and management by telemedicine. The patient expressed understanding and agreed to proceed.   Evaluation Performed:  Follow-up visit  Date:  01/21/2020   ID:  Jake Levine, DOB 1960/05/14, MRN [id number]  Patient Location:  [address] [name] [address]   Provider location:   [facility] [place], [place] office  PCP:  Patient, No Pcp Per  Cardiologist:  Jake Levine  Chief Complaint  Patient presents with  . Follow-up    Annual  Pt states no Sx.     History of Present Illness:    Jake Levine is a 59 y.o. male who presents via audio/video conferencing for a telehealth visit today.   The patient does not symptoms concerning for COVID-19 infection (fever, chills, cough, or new SHORTNESS OF BREATH).   Patient has a past medical history of HTN (untreated for years), nonischemic cardiomyopathy,  Ejection fraction down to 10-15% in 2016 obstructive sleep apnea, previously on CPAP but reportedly improved with 70 pound weight loss  Ejection fraction 50 to 55% in August 2018 who presents  for follow up of his chronic systolic CHF and hypertensive heart disease.   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 There was moderate concentric hypertrophy. Systolic function was normal. The estimated ejection fraction was in the range of 50% to 55%. Wall motion was normal; there were no regional wall motion abnormalities. Doppler parameters are consistent with abnormal left ventricular relaxation (grade 1 diastolic dysfunction). - Left atrium: The atrium was mildly dilated. - Right ventricle: Systolic function was normal. - Pulmonary arteries: Systolic pressure was within the normal range.   Other past medical history Long history of poorly controlled hypertension admitted to [facility] 1/15-1/19/16 with acute systolic CF/dilated cardiomyopathy in the setting of severe hypertensive heart disease. BP was 172/115 upon presentation.  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 Echo on 04/18/14 showed an EF of 10-15%, Wall thickness wasincreased in a pattern of mild LVH. There was moderate concentric hypertrophy. Unable to exclude RMA. GR3DD. Moderately dilated LA.  He underwent cardiac cath on 1/18 that showed widely patent coronary arteries, elevated  LVEDP, known severe dilated cardiomyopathy. It was suspected the patient had severe hypertensive heart disease and medical management was recommended.     Prior CV studies:   The following studies were reviewed today:   Past Medical History:  Diagnosis Date  . Ascending aorta dilatation (HCC)    a. CTA 09/30/2014: prominent ascending ascending aorta measuring 3.9 cm, recommended annual imaging follow up by CTA or MRA  . Cardiomegaly   . Chronic systolic CHF (congestive heart failure) (HCC)    a. echo 04/2014: EF 10-15%, mild LVH, mod concentrtic hypertrophy,  RWMA cannot be excluded, GR3DD, left atrium mod dilated at 53 mm  . CKD (chronic kidney disease), stage III (HCC)    a. baseline SCr 1.3  . H/O medication noncompliance   . Hilar lymphadenopathy    a. suggestive of sarcoidosis  . HLD (hyperlipidemia)   . Hypertension   . Hypertensive heart disease   . NICM (nonischemic cardiomyopathy) (HCC)    a. cath 04/2014: widely patent coronary arteries, elevted LVEDP, known severe dilated CM  . Sleep apnea    "lost 75#; no need for mask anymore" (04/18/14)   Past Surgical History:  Procedure Laterality Date  . FACIAL FRACTURE SURGERY Right ~ 1990  . LEFT HEART CATHETERIZATION WITH CORONARY ANGIOGRAM N/A 04/20/2014   Procedure: LEFT HEART CATHETERIZATION WITH CORONARY ANGIOGRAM;  Surgeon: [name], MD;  [facility];  Service: Cardiovascular;  Laterality: N/A;  . TUMOR EXCISION Left <2010   "fatty growth"  . VASECTOMY      Allergies:   Patient has no known allergies.   Social History   Tobacco Use  . Smoking status: Never Smoker  . Smokeless tobacco: Never Used  Substance Use Topics  . Alcohol use: Yes    Alcohol/week: 2.0 standard drinks    Types: 2 Glasses of wine per week  . Drug use: No     Current Outpatient Medications on File Prior to Visit  Medication Sig Dispense Refill  . acetaminophen (TYLENOL) 325 MG tablet Take 650 mg by mouth every 6 (six) hours as needed.    Marland Kitchen. albuterol (PROVENTIL HFA;VENTOLIN HFA) 108 (90 BASE) MCG/ACT inhaler Inhale 1-2 puffs into the lungs every 6 (six) hours as needed for  wheezing or shortness of breath. Reported on 04/16/2015    . aspirin EC 81 MG tablet Take 81 mg by mouth daily.    Marland Kitchen. loratadine (CLARITIN) 10 MG tablet Take 10 mg by mouth daily.     No current facility-administered medications on file prior to visit.     Family Hx: The patient's family history includes Alcohol abuse in his maternal grandfather, maternal uncle, and paternal grandmother; Cancer in his father, maternal grandfather, and maternal uncle; Colon cancer in his father; Diabetes in his father; Lupus in his sister; Stroke in his mother.  ROS:   Please see the history of present illness.    Review of Systems  Constitutional: Negative.   HENT: Negative.   Respiratory: Negative.   Cardiovascular: Negative.   Gastrointestinal: Negative.   Musculoskeletal: Negative.   Neurological: Negative.   Psychiatric/Behavioral: Negative.   All other systems reviewed and are negative.    Labs/Other Tests and Data Reviewed:    Recent Labs: 02/03/2019: ALT 15   Recent Lipid Panel Lab Results  Component Value Date/Time   CHOL 162 02/03/2019 08:40 AM   TRIG 64 02/03/2019 08:40 AM   HDL 68 02/03/2019 08:40 AM   CHOLHDL 2.4 02/03/2019 08:40 AM  CHOLHDL 3.3 04/20/2014 01:28 AM   LDLCALC 81 02/03/2019 08:40 AM    Wt Readings from Last 3 Encounters:  01/21/20 156 lb (70.8 kg)  01/22/19 167 lb (75.8 kg)  12/31/17 170 lb 8 oz (77.3 kg)     Exam:    Vital Signs: Vital signs may also be detailed in the HPI BP (!) 160/90 (BP Location: Right Arm)   Pulse 60   Ht 5\' 6"  (1.676 m)   Wt 156 lb (70.8 kg)   BMI 25.18 kg/m   Wt Readings from Last 3 Encounters:  01/21/20 156 lb (70.8 kg)  01/22/19 167 lb (75.8 kg)  12/31/17 170 lb 8 oz (77.3 kg)   Temp Readings from Last 3 Encounters:  04/16/15 97.4 F (36.3 C) (Oral)  03/18/15 98 F (36.7 C) (Oral)  04/21/14 98.3 F (36.8 C) (Oral)   BP Readings from Last 3 Encounters:  01/21/20 (!) 160/90  12/31/17 130/80  10/31/16 (!) 160/100     Pulse Readings from Last 3 Encounters:  01/21/20 60  12/31/17 61  10/31/16 (!) 56    Well nourished, well developed male in no acute distress. Constitutional:  oriented to person, place, and time. No distress.   ASSESSMENT & PLAN:    Problem List Items Addressed This Visit      Cardiology Problems   HLD (hyperlipidemia)   Relevant Medications   carvedilol (COREG) 25 MG tablet   cloNIDine (CATAPRES) 0.3 MG tablet   furosemide (LASIX) 20 MG tablet   hydrALAZINE (APRESOLINE) 100 MG tablet   isosorbide mononitrate (IMDUR) 30 MG 24 hr tablet   isosorbide mononitrate (IMDUR) 60 MG 24 hr tablet   losartan (COZAAR) 100 MG tablet   simvastatin (ZOCOR) 20 MG tablet   Chronic systolic CHF (congestive heart failure) (HCC) - Primary   Relevant Medications   carvedilol (COREG) 25 MG tablet   cloNIDine (CATAPRES) 0.3 MG tablet   furosemide (LASIX) 20 MG tablet   hydrALAZINE (APRESOLINE) 100 MG tablet   isosorbide mononitrate (IMDUR) 30 MG 24 hr tablet   isosorbide mononitrate (IMDUR) 60 MG 24 hr tablet   losartan (COZAAR) 100 MG tablet   simvastatin (ZOCOR) 20 MG tablet   Ascending aorta dilatation (HCC)   Relevant Medications   carvedilol (COREG) 25 MG tablet   cloNIDine (CATAPRES) 0.3 MG tablet   furosemide (LASIX) 20 MG tablet   hydrALAZINE (APRESOLINE) 100 MG tablet   isosorbide mononitrate (IMDUR) 30 MG 24 hr tablet   isosorbide mononitrate (IMDUR) 60 MG 24 hr tablet   losartan (COZAAR) 100 MG tablet   simvastatin (ZOCOR) 20 MG tablet   Cardiomyopathy- EF 10-1%- etiology not yet determined   Relevant Medications   carvedilol (COREG) 25 MG tablet   cloNIDine (CATAPRES) 0.3 MG tablet   furosemide (LASIX) 20 MG tablet   hydrALAZINE (APRESOLINE) 100 MG tablet   isosorbide mononitrate (IMDUR) 30 MG 24 hr tablet   isosorbide mononitrate (IMDUR) 60 MG 24 hr tablet   losartan (COZAAR) 100 MG tablet   simvastatin (ZOCOR) 20 MG tablet     Accelerated hypertension - In general  blood pressure has been well controlled Was elevated today in the setting of had not taken his medications yet this morning, was doing push-ups sit ups pressure initially 200 but improved to 160 in recovery and is taking his medications He reports blood pressure typically very high first thing in the morning before his medications We recommended he take the hydralazine late before bed and first thing  when he gets up Other medications will stay the same 7 AM 7 PM  Dilated cardiomyopathy (HCC) - Plan: EKG 12-Lead Ejection fraction improved in 2018, Overall feels well, no concerns for congestive heart failure symptoms  Pure hypercholesterolemia Continue simvastatin, numbers at goal Had lab work done through primary care, we will request these labs for our system  Chronic systolic CHF (congestive heart failure) (HCC) -  Echocardiogram January 2016 ejection fraction was 10-15% Repeat echocardiogram August 2018 ejection fraction 50 to 55% Sounds euvolemic by his report, overall feeling well, labs requested  OSA (obstructive sleep apnea) -  Not on CPAP, "dont need it anymore after weight loss"  CKD (chronic kidney disease), stage III Done through primary care   COVID-19 Education: The signs and symptoms of COVID-19 were discussed with the patient and how to seek care for testing (follow up with PCP or arrange E-visit).  The importance of social distancing was discussed today.  Patient Risk:   After full review of this patients clinical status, I feel that they are at least moderate risk at this time.  Time:   Today, I have spent 25 minutes with the patient with telehealth technology discussing the cardiac and medical problems/diagnoses detailed above   Additional 10 min spent reviewing the chart prior to patient visit today   Medication Adjustments/Labs and Tests Ordered: Current medicines are reviewed at length with the patient today.  Concerns regarding medicines are outlined  above.   Tests Ordered: No tests ordered   Medication Changes: No changes made     Signed, Julien Nordmann, MD  [facility] [address], [place], [place] [address]

## 2020-01-21 NOTE — Patient Instructions (Addendum)
Medication Instructions:  No changes  needs refills 90 days, 3 months  If you need a refill on your cardiac medications before your next appointment, please call your pharmacy.    Lab work: No new labs needed   If you have labs (blood work) drawn today and your tests are completely normal, you will receive your results only by:  MyChart Message (if you have MyChart) OR  A paper copy in the mail If you have any lab test that is abnormal or we need to change your treatment, we will call you to review the results.   Testing/Procedures: No new testing needed   Follow-Up: At [facility], you and your health needs are our priority.  As part of our continuing mission to provide you with exceptional heart care, we have created designated Provider Care Teams.  These Care Teams include your primary Cardiologist (physician) and Advanced Practice Providers (APPs -  Physician Assistants and Nurse Practitioners) who all work together to provide you with the care you need, when you need it.  You will need a follow up appointment in 12 months in office   Providers on your designated Care Team:    Nicolasa Ducking, NP  Eula Listen, PA-C  Marisue Ivan, PA-C  Any Other Special Instructions Will Be Listed Below (If Applicable).  COVID-19 Vaccine Information can be found at: [email or web address] For questions related to vaccine distribution or appointments, please email [email or web address] or call [phone].

## 2020-06-15 ENCOUNTER — Encounter: Payer: Self-pay | Admitting: Urology

## 2020-06-15 ENCOUNTER — Other Ambulatory Visit: Payer: Self-pay

## 2020-06-15 ENCOUNTER — Ambulatory Visit (INDEPENDENT_AMBULATORY_CARE_PROVIDER_SITE_OTHER): Payer: Self-pay | Admitting: Urology

## 2020-06-15 VITALS — BP 152/83 | HR 60 | Ht 66.0 in | Wt 160.0 lb

## 2020-06-15 DIAGNOSIS — R972 Elevated prostate specific antigen [PSA]: Secondary | ICD-10-CM

## 2020-06-15 DIAGNOSIS — R351 Nocturia: Secondary | ICD-10-CM

## 2020-06-15 NOTE — Progress Notes (Signed)
06/15/2020 9:29 AM   Jake Levine 1961/02/07 [id number]  Referring provider: Miki Kins, FNP [address] [place],  [place] [address]  Chief Complaint  Patient presents with  . Elevated PSA    HPI: 60 year old male with multiple medical comorbidities including personal history of CHF who presents today for further evaluation of elevated PSA.  He underwent routine annual prostate cancer screening at his PCP this year.  His PSA was 5.8.  We have no previous PSAs for comparison.  He does have a family history of cancer, his father died at age 76 of some sort of malignancy which was either colon or prostate cancer, some uncertainty.  He does believe that he had a prostate biopsy in the remote past about 10 years ago.  He describes the procedure fairly accurately.  He does not remember if he ever saw a urologist or who it was.  He believes it was somewhere in [place].  He believes this was for the purpose of enlarged prostate.  He does not remember what his PSA was at the time.  In terms of urinary symptoms, he complains of nocturia x3 otherwise no daytime symptoms.  This is not particularly bothersome to him.  This is longstanding.  He associates it with his other medical problems.  It is worse if he forgets to take his other medications.  No dysuria or gross hematuria.  No recent UTIs.   PMH: Past Medical History:  Diagnosis Date  . Ascending aorta dilatation (HCC)    a. CTA 09/30/2014: prominent ascending ascending aorta measuring 3.9 cm, recommended annual imaging follow up by CTA or MRA  . Cardiomegaly   . Chronic systolic CHF (congestive heart failure) (HCC)    a. echo 04/2014: EF 10-15%, mild LVH, mod concentrtic hypertrophy,  RWMA cannot be excluded, GR3DD, left atrium mod dilated at 53 mm  . CKD (chronic kidney disease), stage III (HCC)    a. baseline SCr 1.3  . H/O medication noncompliance   . Hilar lymphadenopathy    a. suggestive of sarcoidosis  . HLD  (hyperlipidemia)   . Hypertension   . Hypertensive heart disease   . NICM (nonischemic cardiomyopathy) (HCC)    a. cath 04/2014: widely patent coronary arteries, elevted LVEDP, known severe dilated CM  . Sleep apnea    "lost 75#; no need for mask anymore" (04/18/14)    Surgical History: Past Surgical History:  Procedure Laterality Date  . FACIAL FRACTURE SURGERY Right ~ 1990  . LEFT HEART CATHETERIZATION WITH CORONARY ANGIOGRAM N/A 04/20/2014   Procedure: LEFT HEART CATHETERIZATION WITH CORONARY ANGIOGRAM;  Surgeon: Micheline Chapman, MD;  Location: [facility] CATH LAB;  Service: Cardiovascular;  Laterality: N/A;  . TUMOR EXCISION Left <2010   "fatty growth"  . VASECTOMY      Home Medications:  Allergies as of 06/15/2020   No Known Allergies     Medication List       Accurate as of June 15, 2020  9:29 AM. If you have any questions, ask your nurse or doctor.        acetaminophen 325 MG tablet Commonly known as: TYLENOL Take 650 mg by mouth every 6 (six) hours as needed.   albuterol 108 (90 Base) MCG/ACT inhaler Commonly known as: VENTOLIN HFA Inhale 1-2 puffs into the lungs every 6 (six) hours as needed for wheezing or shortness of breath. Reported on 04/16/2015   aspirin EC 81 MG tablet Take 81 mg by mouth daily.   carvedilol 25 MG  tablet Commonly known as: COREG Take 1 tablet (25 mg total) by mouth 2 (two) times daily with a meal.   cloNIDine 0.3 MG tablet Commonly known as: CATAPRES Take 1 tablet (0.3 mg total) by mouth 2 (two) times daily.   furosemide 20 MG tablet Commonly known as: LASIX Take 1 tablet (20 mg total) by mouth daily.   hydrALAZINE 100 MG tablet Commonly known as: APRESOLINE Take 1 tablet (100 mg total) by mouth 3 (three) times daily.   isosorbide mononitrate 30 MG 24 hr tablet Commonly known as: IMDUR Take 1 tablet (30 mg total) by mouth at bedtime. Take with 60 mg pill for total of 90 mg at bedtime.   isosorbide mononitrate 60 MG 24 hr  tablet Commonly known as: IMDUR Take 1 tablet (60 mg total) by mouth 2 (two) times daily.   loratadine 10 MG tablet Commonly known as: CLARITIN Take 10 mg by mouth daily.   losartan 100 MG tablet Commonly known as: COZAAR Take 1 tablet (100 mg total) by mouth daily.   potassium chloride SA 20 MEQ tablet Commonly known as: KLOR-CON Take 1 tablet (20 mEq total) by mouth daily.   simvastatin 20 MG tablet Commonly known as: ZOCOR TAKE 1 TABLET BY MOUTH ONCE DAILY AT  6PM       Allergies: No Known Allergies  Family History: Family History  Problem Relation Age of Onset  . Stroke Mother   . Colon cancer Father   . Cancer Father        father  . Diabetes Father   . Lupus Sister   . Cancer Maternal Uncle   . Alcohol abuse Maternal Uncle   . Alcohol abuse Maternal Grandfather   . Cancer Maternal Grandfather   . Alcohol abuse Paternal Grandmother     Social History:  reports that he has never smoked. He has never used smokeless tobacco. He reports current alcohol use of about 2.0 standard drinks of alcohol per week. He reports that he does not use drugs.   Physical Exam: BP (!) 152/83   Pulse 60   Ht 5\' 6"  (1.676 m)   Wt 160 lb (72.6 kg)   BMI 25.82 kg/m   Constitutional:  Alert and oriented, No acute distress. HEENT: [place] AT, moist mucus membranes.  Trachea midline, no masses. Cardiovascular: No clubbing, cyanosis, or edema. Respiratory: Normal respiratory effort, no increased work of breathing. GI: Abdomen is soft, nontender, nondistended, no abdominal masses Rectal: Normal sphincter tone.  Enlarged 50+ cc prostate, nontender, no nodules Skin: No rashes, bruises or suspicious lesions. Neurologic: Grossly intact, no focal deficits, moving all 4 extremities. Psychiatric: Normal mood and affect.  Laboratory Data: Personal history of CKD, baseline creatinine 1.38   Assessment & Plan:    1. Elevated PSA African-American male possibly with a family history with an  isolated PSA of 5.7 in the setting of prostamegaly.  No previous PSAs for comparison.   We reviewed the implications of an elevated PSA and the uncertainty surrounding it. In general, a man's PSA increases with age and is produced by both normal and cancerous prostate tissue. Differential for elevated PSA is BPH, prostate cancer, infection, recent intercourse/ejaculation, prostate infarction, recent urethroscopic manipulation (foley placement/cystoscopy) and prostatitis. Management of an elevated PSA can include observation or prostate biopsy and wediscussed this in detail. We discussed that indications for prostate biopsy are defined by age and race specific PSA cutoffs as well as a PSA velocity of 0.75/year.  Plan to repeat his  PSA today.  Given his additional medical comorbidities especially cardiac in nature, we may consider following his PSA to assess for PSA trend.  We will hold off on biopsy at this time unless his PSA begins to rise.  May also consider prostate MRI down the road.  He is agreeable this plan. - PSA; Future  2. Nocturia Likely multifactorial including underlying medical conditions, minimal bother Behavioral modification discussed, avoidance of beverages before bedtime   Vanna Scotland, MD  [facility] [address], [address] [place], [place] [address] [phone]

## 2020-06-16 LAB — PSA: Prostate Specific Ag, Serum: 3.8 ng/mL (ref 0.0–4.0)

## 2020-06-21 ENCOUNTER — Telehealth: Payer: Self-pay

## 2020-06-21 NOTE — Telephone Encounter (Signed)
Pt aware and verbalized understanding.  

## 2020-06-21 NOTE — Telephone Encounter (Signed)
-----   Message from Vanna Scotland, MD sent at 06/16/2020 11:09 AM EDT ----- Randie Heinz news, and rechecking your PSA, its come down to 3.8 which is within the normal range and probably appropriate given the size of your prostate.  I like to see you again next year with a recheck of your PSA.  No need for biopsy at this point in time.  Vanna Scotland, MD

## 2021-01-21 ENCOUNTER — Encounter: Payer: Self-pay | Admitting: Cardiovascular Disease

## 2021-01-21 ENCOUNTER — Ambulatory Visit (INDEPENDENT_AMBULATORY_CARE_PROVIDER_SITE_OTHER): Payer: Self-pay | Admitting: Cardiovascular Disease

## 2021-01-21 ENCOUNTER — Other Ambulatory Visit: Payer: Self-pay

## 2021-01-21 VITALS — BP 140/88 | HR 59 | Ht 66.0 in | Wt 165.5 lb

## 2021-01-21 DIAGNOSIS — I420 Dilated cardiomyopathy: Secondary | ICD-10-CM

## 2021-01-21 DIAGNOSIS — I77810 Thoracic aortic ectasia: Secondary | ICD-10-CM

## 2021-01-21 DIAGNOSIS — I5022 Chronic systolic (congestive) heart failure: Secondary | ICD-10-CM

## 2021-01-21 DIAGNOSIS — I10 Essential (primary) hypertension: Secondary | ICD-10-CM

## 2021-01-21 DIAGNOSIS — Z79899 Other long term (current) drug therapy: Secondary | ICD-10-CM

## 2021-01-21 DIAGNOSIS — N1831 Chronic kidney disease, stage 3a: Secondary | ICD-10-CM

## 2021-01-21 DIAGNOSIS — E782 Mixed hyperlipidemia: Secondary | ICD-10-CM

## 2021-01-21 DIAGNOSIS — G4733 Obstructive sleep apnea (adult) (pediatric): Secondary | ICD-10-CM

## 2021-01-21 MED ORDER — CARVEDILOL 25 MG PO TABS: 25.0000 mg | ORAL_TABLET | Freq: Two times a day (BID) | ORAL | 3 refills | Status: DC

## 2021-01-21 MED ORDER — ISOSORBIDE MONONITRATE ER 60 MG PO TB24: 60.0000 mg | ORAL_TABLET | Freq: Two times a day (BID) | ORAL | 3 refills | Status: DC

## 2021-01-21 MED ORDER — CLONIDINE HCL 0.3 MG PO TABS: 0.3000 mg | ORAL_TABLET | Freq: Two times a day (BID) | ORAL | 3 refills | Status: DC

## 2021-01-21 MED ORDER — POTASSIUM CHLORIDE CRYS ER 20 MEQ PO TBCR: 20.0000 meq | EXTENDED_RELEASE_TABLET | Freq: Every day | ORAL | 3 refills | Status: DC

## 2021-01-21 MED ORDER — LOSARTAN POTASSIUM 100 MG PO TABS
100.0000 mg | ORAL_TABLET | Freq: Every day | ORAL | 3 refills | Status: DC
Start: 2021-01-21 — End: 2022-02-21

## 2021-01-21 MED ORDER — ISOSORBIDE MONONITRATE ER 30 MG PO TB24: 30.0000 mg | ORAL_TABLET | Freq: Every day | ORAL | 3 refills | Status: DC

## 2021-01-21 MED ORDER — FUROSEMIDE 20 MG PO TABS: 20.0000 mg | ORAL_TABLET | Freq: Every day | ORAL | 3 refills | Status: DC

## 2021-01-21 MED ORDER — SIMVASTATIN 20 MG PO TABS: ORAL_TABLET | ORAL | 3 refills | Status: DC

## 2021-01-21 MED ORDER — HYDRALAZINE HCL 100 MG PO TABS: 100.0000 mg | ORAL_TABLET | Freq: Three times a day (TID) | ORAL | 3 refills | Status: DC

## 2021-01-21 NOTE — Patient Instructions (Addendum)
Medication Instructions:  No changes  If you need a refill on your cardiac medications before your next appointment, please call your pharmacy.    Lab work: Aldosterone + renin activity w/ ratio  Results will be on MyChart, we will call with ABNORMAL   Testing/Procedures: No new testing needed  Follow-Up: At [facility], you and your health needs are our priority.  As part of our continuing mission to provide you with exceptional heart care, we have created designated Provider Care Teams.  These Care Teams include your primary Cardiologist (physician) and Advanced Practice Providers (APPs -  Physician Assistants and Nurse Practitioners) who all work together to provide you with the care you need, when you need it.  You will need a follow up appointment in 12 months  Providers on your designated Care Team:   Nicolasa Ducking, NP Eula Listen, PA-C Cadence Fransico [place], [place]  COVID-19 Vaccine Information can be found at: [email or web address] For questions related to vaccine distribution or appointments, please email [email or web address] or call [phone].

## 2021-01-21 NOTE — Progress Notes (Signed)
Date:  01/21/2021   ID:  Jake Levine, DOB 04/26/1960, MRN [id number]  Patient Location:  [address] [place] [address]   Provider location:   [facility], [place] office  PCP:  Lurline Del, MD  Cardiologist:  Fonnie Mu  Chief Complaint  Patient presents with   12 month follow up     "Doing well." Medications reviewed by the patient verbally.     History of Present Illness:    Jake Levine is a 60 y.o. male past medical history of HTN (untreated for years), nonischemic cardiomyopathy,  Ejection fraction down to 10-15% in 2016 obstructive sleep apnea, previously on CPAP but reportedly improved with 70 pound weight loss  Ejection fraction 50 to 55% in August 2018 who presents for follow up of his chronic systolic CHF and hypertensive heart disease.   Last seen by myself in clinic September 2019 Telemetry visit with myself October 2021  Continues to follow with PMD: Marchelle Folks, allied health Had labs done, we will request for records  Reports that he feels well, exercises on a regular basis, no chest pain or shortness of breath on exertion  Continues to work in [employer], active, Compliant with medications  Discussed that daughter when pregnant had a high blood pressure they found renal mass Wonders if he has adrenal issues, would like to be tested We do not have any lab work to review  No leg swelling  Not on CPAP since weight loss  EKG personally reviewed by myself on todays visit Normal sinus rhythm rate 59 bpm APCs unable to exclude old anterior MI, T wave abnormality No change from prior EKG 2019  Other past medical history reviewed echo 11/2016  - Left ventricle: The cavity size was normal. There was moderate   concentric hypertrophy. Systolic function was normal. The   estimated ejection fraction was in the range of 50% to 55%. Wall   motion was normal; there were no regional  wall motion   abnormalities. Doppler parameters are consistent with abnormal   left ventricular relaxation (grade 1 diastolic dysfunction). - Left atrium: The atrium was mildly dilated. - Right ventricle: Systolic function was normal. - Pulmonary arteries: Systolic pressure was within the normal   range.    Long history of poorly controlled hypertension  admitted to [facility] 1/15-1/19/16 with acute systolic CF/dilated cardiomyopathy in the setting of severe hypertensive heart disease. BP was 172/115 upon presentation.  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  Echo on 04/18/14 showed an EF of 10-15%, Wall thickness was increased in a pattern of mild LVH. There was moderate concentric hypertrophy.  Unable to exclude RMA. GR3DD. Moderately dilated LA.   He underwent cardiac cath on 1/18 that showed widely patent coronary arteries, elevated LVEDP, known severe dilated cardiomyopathy. It was suspected the patient had severe hypertensive heart disease and medical management was recommended.     Prior CV studies:   The following studies were reviewed today:   Past Medical History:  Diagnosis Date   Ascending aorta dilatation (HCC)    a. CTA 09/30/2014: prominent ascending ascending aorta measuring 3.9 cm, recommended annual imaging follow up by CTA or MRA   Cardiomegaly    Chronic systolic CHF (congestive heart failure) (HCC)    a. echo 04/2014: EF 10-15%, mild LVH, mod concentrtic hypertrophy,  RWMA cannot be excluded, GR3DD, left atrium mod dilated at  53 mm   CKD (chronic kidney disease), stage III (HCC)    a. baseline SCr 1.3   H/O medication noncompliance    Hilar lymphadenopathy    a. suggestive of sarcoidosis   HLD (hyperlipidemia)    Hypertension    Hypertensive heart disease    NICM (nonischemic cardiomyopathy) (HCC)    a. cath 04/2014: widely patent coronary arteries, elevted LVEDP, known  severe dilated CM   Sleep apnea    "lost 75#; no need for mask anymore" (04/18/14)   Past Surgical History:  Procedure Laterality Date   FACIAL FRACTURE SURGERY Right ~ 1990   LEFT HEART CATHETERIZATION WITH CORONARY ANGIOGRAM N/A 04/20/2014   Procedure: LEFT HEART CATHETERIZATION WITH CORONARY ANGIOGRAM;  Surgeon: Micheline Chapman, MD;  Location: [facility] CATH LAB;  Service: Cardiovascular;  Laterality: N/A;   TUMOR EXCISION Left <2010   "fatty growth"   VASECTOMY      Allergies:   Patient has no known allergies.   Social History   Tobacco Use   Smoking status: Never   Smokeless tobacco: Never  Substance Use Topics   Alcohol use: Yes    Alcohol/week: 2.0 standard drinks    Types: 2 Glasses of wine per week   Drug use: No     Current Outpatient Medications on File Prior to Visit  Medication Sig Dispense Refill   aspirin EC 81 MG tablet Take 81 mg by mouth daily.     carvedilol (COREG) 25 MG tablet Take 1 tablet (25 mg total) by mouth 2 (two) times daily with a meal. 180 tablet 3   cloNIDine (CATAPRES) 0.3 MG tablet Take 1 tablet (0.3 mg total) by mouth 2 (two) times daily. 180 tablet 3   furosemide (LASIX) 20 MG tablet Take 1 tablet (20 mg total) by mouth daily. 90 tablet 3   hydrALAZINE (APRESOLINE) 100 MG tablet Take 1 tablet (100 mg total) by mouth 3 (three) times daily. 270 tablet 3   isosorbide mononitrate (IMDUR) 30 MG 24 hr tablet Take 1 tablet (30 mg total) by mouth at bedtime. Take with 60 mg pill for total of 90 mg at bedtime. 90 tablet 3   isosorbide mononitrate (IMDUR) 60 MG 24 hr tablet Take 1 tablet (60 mg total) by mouth 2 (two) times daily. 180 tablet 3   losartan (COZAAR) 100 MG tablet Take 1 tablet (100 mg total) by mouth daily. 90 tablet 3   potassium chloride SA (KLOR-CON) 20 MEQ tablet Take 1 tablet (20 mEq total) by mouth daily. 90 tablet 3   simvastatin (ZOCOR) 20 MG tablet TAKE 1 TABLET BY MOUTH ONCE DAILY AT  6PM 90 tablet 3   acetaminophen (TYLENOL) 325 MG  tablet Take 650 mg by mouth every 6 (six) hours as needed. (Patient not taking: Reported on 01/21/2021)     albuterol (PROVENTIL HFA;VENTOLIN HFA) 108 (90 BASE) MCG/ACT inhaler Inhale 1-2 puffs into the lungs every 6 (six) hours as needed for wheezing or shortness of breath. Reported on 04/16/2015 (Patient not taking: Reported on 01/21/2021)     loratadine (CLARITIN) 10 MG tablet Take 10 mg by mouth daily. (Patient not taking: Reported on 01/21/2021)     No current facility-administered medications on file prior to visit.     Family Hx: The patient's family history includes Alcohol abuse in his maternal grandfather, maternal uncle, and paternal grandmother; Cancer in his father, maternal grandfather, and maternal uncle; Colon cancer in his father; Diabetes in his father; Lupus in his sister; Stroke  in his mother.  ROS:   Please see the history of present illness.    Review of Systems  Constitutional: Negative.   HENT: Negative.    Respiratory: Negative.    Cardiovascular: Negative.   Gastrointestinal: Negative.   Musculoskeletal: Negative.   Neurological: Negative.   Psychiatric/Behavioral: Negative.    All other systems reviewed and are negative.   Labs/Other Tests and Data Reviewed:    Recent Labs: No results found for requested labs within last 8760 hours.   Recent Lipid Panel Lab Results  Component Value Date/Time   CHOL 162 02/03/2019 08:40 AM   TRIG 64 02/03/2019 08:40 AM   HDL 68 02/03/2019 08:40 AM   CHOLHDL 2.4 02/03/2019 08:40 AM   CHOLHDL 3.3 04/20/2014 01:28 AM   LDLCALC 81 02/03/2019 08:40 AM    Wt Readings from Last 3 Encounters:  01/21/21 165 lb 8 oz (75.1 kg)  06/15/20 160 lb (72.6 kg)  01/21/20 156 lb (70.8 kg)     Exam:    Vital Signs: Vital signs may also be detailed in the HPI BP 140/88 (BP Location: Left Arm, Patient Position: Sitting, Cuff Size: Normal)   Pulse (!) 59   Ht 5\' 6"  (1.676 m)   Wt 165 lb 8 oz (75.1 kg)   SpO2 99%   BMI 26.71 kg/m    Constitutional:  oriented to person, place, and time. No distress.  HENT:  Head: Grossly normal Eyes:  no discharge. No scleral icterus.  Neck: No JVD, no carotid bruits  Cardiovascular: Regular rate and rhythm, no murmurs appreciated Pulmonary/Chest: Clear to auscultation bilaterally, no wheezes or rails Abdominal: Soft.  no distension.  no tenderness.  Musculoskeletal: Normal range of motion Neurological:  normal muscle tone. Coordination normal. No atrophy Skin: Skin warm and dry Psychiatric: normal affect, pleasant   ASSESSMENT & PLAN:    Problem List Items Addressed This Visit       Cardiology Problems   HLD (hyperlipidemia)   Chronic systolic CHF (congestive heart failure) (HCC) - Primary   Relevant Orders   EKG 12-Lead   Ascending aorta dilatation (HCC)   Cardiomyopathy- EF 10-1%- etiology not yet determined   Relevant Orders   EKG 12-Lead   Accelerated hypertension     Other   OSA (obstructive sleep apnea)   CKD (chronic kidney disease), stage III (HCC)  Accelerated hypertension - Blood pressure well controlled, discussed daughter's adrenal issues, he would like to be tested We will not make any medication changes at this time We will check aldosterone renin ratio at his request   Dilated cardiomyopathy (HCC) - Plan: EKG 12-Lead Ejection fraction improved in 2018, With medication compliance, no symptoms concerning for regression  Pure hypercholesterolemia Continue simvastatin,  Lipids requested from primary care prior system   Chronic systolic CHF (congestive heart failure) (HCC) -  Echocardiogram January 2016 ejection fraction was 10-15% echocardiogram August 2018 ejection fraction 50 to 55% Euvolemic on today's visit, requested labs from primary care   OSA (obstructive sleep apnea) -  Not on CPAP, "dont need it anymore after weight loss"  CKD (chronic kidney disease), stage III Done through primary care     Total encounter time more than 15  minutes  Greater than 50% was spent in counseling and coordination of care with the patient     Signed, September 2018, MD  [facility] [address], [place], [place]

## 2021-01-25 LAB — BASIC METABOLIC PANEL
BUN/Creatinine Ratio: 21 — ABNORMAL HIGH (ref 9–20)
BUN: 23 mg/dL (ref 6–24)
CO2: 26 mmol/L (ref 20–29)
Calcium: 9.1 mg/dL (ref 8.7–10.2)
Chloride: 107 mmol/L — ABNORMAL HIGH (ref 96–106)
Creatinine, Ser: 1.11 mg/dL (ref 0.76–1.27)
Glucose: 105 mg/dL — ABNORMAL HIGH (ref 70–99)
Potassium: 4.2 mmol/L (ref 3.5–5.2)
Sodium: 143 mmol/L (ref 134–144)
eGFR: 77 mL/min/{1.73_m2} (ref >59)

## 2021-01-25 LAB — CBC

## 2021-01-25 LAB — LIPID PANEL
Chol/HDL Ratio: 2.3 ratio (ref 0.0–5.0)
Cholesterol, Total: 158 mg/dL (ref 100–199)
HDL: 70 mg/dL (ref >39)
LDL Chol Calc (NIH): 79 mg/dL (ref 0–99)
Triglycerides: 40 mg/dL (ref 0–149)
VLDL Cholesterol Cal: 9 mg/dL (ref 5–40)

## 2021-01-25 LAB — ALDOSTERONE + RENIN ACTIVITY W/ RATIO
ALDOS/RENIN RATIO: >189.8 — ABNORMAL HIGH (ref 0.0–30.0)
ALDOSTERONE: 31.7 ng/dL — ABNORMAL HIGH (ref 0.0–30.0)
Renin: <0.167 ng/mL/hr — ABNORMAL LOW (ref 0.167–5.380)

## 2021-01-31 ENCOUNTER — Telehealth: Payer: Self-pay

## 2021-01-31 DIAGNOSIS — E269 Hyperaldosteronism, unspecified: Secondary | ICD-10-CM

## 2021-01-31 DIAGNOSIS — R7989 Other specified abnormal findings of blood chemistry: Secondary | ICD-10-CM

## 2021-01-31 NOTE — Telephone Encounter (Signed)
Left detail message on VM of pt's recent lab results okay by DPR, [name] advised   "Aldosterone to renal ratio is markedly elevated  Curious that daughter had potentially similar issues as he mentioned on last office visit   Difficult to interpret the number but it appears it was also noticed 11 years ago when he was evaluated by nephrology,  MRI at that time did not show any adrenal masses  Unable able to find the lab work but able to see the MRI which looked good   Lab work also difficult to interpret given 2 of his medications can contribute to the abnormal lab work numbers including carvedilol and clonidine  -Would probably do 1 of 2 things, we can try to change medications that do not affect the renin  level ,pushing it down, currently very low   Second option would be to go talk with endocrinology to see if additional secondary confirmatory testing needed   Good website for him to read:  https://[email or web address].org/[email or web address]-1-aldosterone-renin-ratio-arr/  On that website it talks about doing additional confirmatory testing  Likely best done under guidance of endocrinology "  At this time, no further recommendations changes, advised to call office for any concerns or questions, otherwise will see at next visit. This was also posted to his MyChart with Dr. Windell Hummingbird comments. Referral to endocrinology placed.

## 2021-06-03 ENCOUNTER — Encounter: Payer: Self-pay | Admitting: Internal Medicine

## 2021-06-03 ENCOUNTER — Ambulatory Visit (INDEPENDENT_AMBULATORY_CARE_PROVIDER_SITE_OTHER): Payer: Self-pay | Admitting: Internal Medicine

## 2021-06-03 ENCOUNTER — Other Ambulatory Visit: Payer: Self-pay

## 2021-06-03 VITALS — BP 130/76 | HR 60 | Ht 66.0 in | Wt 155.8 lb

## 2021-06-03 DIAGNOSIS — E269 Hyperaldosteronism, unspecified: Secondary | ICD-10-CM

## 2021-06-03 LAB — BASIC METABOLIC PANEL
BUN: 22 mg/dL (ref 6–23)
CO2: 28 mEq/L (ref 19–32)
Calcium: 9.3 mg/dL (ref 8.4–10.5)
Chloride: 106 mEq/L (ref 96–112)
Creatinine, Ser: 1.2 mg/dL (ref 0.40–1.50)
GFR: 66.56 mL/min (ref >60.00)
Glucose, Bld: 106 mg/dL — ABNORMAL HIGH (ref 70–99)
Potassium: 4.1 mEq/L (ref 3.5–5.1)
Sodium: 141 mEq/L (ref 135–145)

## 2021-06-03 MED ORDER — SODIUM CHLORIDE 1 G PO TABS: 2.0000 g | ORAL_TABLET | Freq: Three times a day (TID) | ORAL | 0 refills | Status: DC

## 2021-06-03 MED ORDER — CLONIDINE HCL 0.3 MG PO TABS
0.3000 mg | ORAL_TABLET | Freq: Three times a day (TID) | ORAL | 3 refills | Status: DC
Start: 2021-06-03 — End: 2022-04-04

## 2021-06-03 NOTE — Patient Instructions (Addendum)
Take TWO of 1 gram sodium chloride tabs Three times a day  for three days ( Day 1, day 2 and Day 3) ? ? ?Increase Potassium to TWO tablets a day during these 3 days  ? ? ? ?At the beginning of day 3 , start 24 hr urine collection  ? ? ? ? ?24-Hour Urine Collection ? ?You will be collecting your urine for a 24-hour period of time. ?Your timer starts with your first urine of the morning (For example - If you first pee at 9AM, your timer will start at 9AM) ?Throw away your first urine of the morning ?Collect your urine every time you pee for the next 24 hours ?STOP your urine collection 24 hours after you started the collection (For example - You would stop at 9AM the day after you started) ? ? ? ?Check Blood pressure every day, If over 150/90 Please take an additional Clonidine and recheck in 1 hour .  ?

## 2021-06-03 NOTE — Progress Notes (Signed)
Name: Jake Levine  MRN/ DOB: [id number], June 26, 1960    Age/ Sex: 61 y.o., male    PCP: Jill Side, MD   Reason for Endocrinology Evaluation: Hyperaldosteronism      Date of Initial Endocrinology Evaluation: 06/03/2021     HPI: Mr. Jake Levine is a 61 y.o. male with a past medical history of HTN, nonischemic cardiomyopathy, OSA. The patient presented for initial endocrinology clinic visit on 06/03/2021 for consultative assistance with his Hyperaldosteronism .   During evaluation by cardiology for HTN, he was noted with suppressed renin < 0.167 ng/mL/hr and elevated aldo at 31.7 ng/dL in 01/2021 .    He was diagnosed with HTN in 2006  Denies palpitations Denies LE edema  Denies SOB  or chest pain  Denies headaches  Weight stable unless intention weight loss  He bruises easily  Denies dizziness  Denies constipation or diarrhea    Carvedilol 25 mg BID Clonidine 0.3 mg BID Furosemide 20 mg daily  Hydralazine 100 mg TID Losartan 100 mg daily    Daughter S/P adrenalectomy      HISTORY:  Past Medical History:  Past Medical History:  Diagnosis Date   Ascending aorta dilatation ([place])    a. CTA 09/30/2014: prominent ascending ascending aorta measuring 3.9 cm, recommended annual imaging follow up by CTA or MRA   Cardiomegaly    Chronic systolic CHF (congestive heart failure) ([place])    a. echo 04/2014: EF 10-15%, mild LVH, mod concentrtic hypertrophy,  RWMA cannot be excluded, GR3DD, left atrium mod dilated at 53 mm   CKD (chronic kidney disease), stage III (HCC)    a. baseline SCr 1.3   H/O medication noncompliance    Hilar lymphadenopathy    a. suggestive of sarcoidosis   HLD (hyperlipidemia)    Hypertension    Hypertensive heart disease    NICM (nonischemic cardiomyopathy) ([place])    a. cath 04/2014: widely patent coronary arteries, elevted LVEDP, known severe dilated CM   Sleep apnea    "lost 75#; no need for mask anymore" (04/18/14)   Past Surgical  History:  Past Surgical History:  Procedure Laterality Date   FACIAL FRACTURE SURGERY Right ~ [place] N/A 04/20/2014   Procedure: LEFT HEART CATHETERIZATION WITH CORONARY ANGIOGRAM;  Surgeon: Blane Ohara, MD;  Location[facility] CATH LAB;  Service: Cardiovascular;  Laterality: N/A;   TUMOR EXCISION Left <2010   "fatty growth"   VASECTOMY      Social History:  reports that he has never smoked. He has never used smokeless tobacco. He reports current alcohol use of about 2.0 standard drinks per week. He reports that he does not use drugs. Family History: family history includes Alcohol abuse in his maternal grandfather, maternal uncle, and paternal grandmother; Cancer in his father, maternal grandfather, and maternal uncle; Colon cancer in his father; Diabetes in his father; Lupus in his sister; Stroke in his mother.   HOME MEDICATIONS: Allergies as of 06/03/2021   No Known Allergies      Medication List        Accurate as of June 03, 2021  7:32 AM. If you have any questions, ask your nurse or doctor.          acetaminophen 325 MG tablet Commonly known as: TYLENOL Take 650 mg by mouth every 6 (six) hours as needed.   albuterol 108 (90 Base) MCG/ACT inhaler Commonly known as: VENTOLIN HFA Inhale 1-2 puffs into the lungs every  6 (six) hours as needed for wheezing or shortness of breath. Reported on 04/16/2015   aspirin EC 81 MG tablet Take 81 mg by mouth daily.   carvedilol 25 MG tablet Commonly known as: COREG Take 1 tablet (25 mg total) by mouth 2 (two) times daily with a meal.   cloNIDine 0.3 MG tablet Commonly known as: CATAPRES Take 1 tablet (0.3 mg total) by mouth 2 (two) times daily.   furosemide 20 MG tablet Commonly known as: LASIX Take 1 tablet (20 mg total) by mouth daily.   hydrALAZINE 100 MG tablet Commonly known as: APRESOLINE Take 1 tablet (100 mg total) by mouth 3 (three) times daily.   isosorbide  mononitrate 30 MG 24 hr tablet Commonly known as: IMDUR Take 1 tablet (30 mg total) by mouth at bedtime. Take with 60 mg pill for total of 90 mg at bedtime.   isosorbide mononitrate 60 MG 24 hr tablet Commonly known as: IMDUR Take 1 tablet (60 mg total) by mouth 2 (two) times daily.   loratadine 10 MG tablet Commonly known as: CLARITIN Take 10 mg by mouth daily.   losartan 100 MG tablet Commonly known as: COZAAR Take 1 tablet (100 mg total) by mouth daily.   potassium chloride SA 20 MEQ tablet Commonly known as: KLOR-CON M Take 1 tablet (20 mEq total) by mouth daily.   simvastatin 20 MG tablet Commonly known as: ZOCOR TAKE 1 TABLET BY MOUTH ONCE DAILY AT  6PM          REVIEW OF SYSTEMS: A comprehensive ROS was conducted with the patient and is negative except as per HPI     OBJECTIVE:  VS: Ht 5' 6"  (1.676 m)    Wt 155 lb 12.8 oz (70.7 kg)    BMI 25.15 kg/m    Wt Readings from Last 3 Encounters:  06/03/21 155 lb 12.8 oz (70.7 kg)  01/21/21 165 lb 8 oz (75.1 kg)  06/15/20 160 lb (72.6 kg)     EXAM: General: Pt appears well and is in NAD  Neck: General: Supple without adenopathy. Thyroid: Thyroid size normal.  No goiter or nodules appreciated. No thyroid bruit.  Lungs: Clear with good BS bilat with no rales, rhonchi, or wheezes  Heart: Auscultation: RRR.  Abdomen: Normoactive bowel sounds, soft, nontender, without masses or organomegaly palpable  Extremities:  BL LE: No pretibial edema normal ROM and strength.  Mental Status: Judgment, insight: Intact Orientation: Oriented to time, place, and person Mood and affect: No depression, anxiety, or agitation     DATA REVIEWED:      Latest Reference Range & Units 01/21/21 08:55  Sodium 134 - 144 mmol/L 143  Potassium 3.5 - 5.2 mmol/L 4.2  Chloride 96 - 106 mmol/L 107 (H)  CO2 20 - 29 mmol/L 26  Glucose 70 - 99 mg/dL 105 (H)  BUN 6 - 24 mg/dL 23  Creatinine 0.76 - 1.27 mg/dL 1.11  Calcium 8.7 - 10.2 mg/dL  9.1  BUN/Creatinine Ratio 9 - 20  21 (H)  eGFR >59 mL/min/1.73 77     Latest Reference Range & Units 01/21/21 08:55  ALDOSTERONE 0.0 - 30.0 ng/dL 31.7 (H)  Renin 0.167 - 5.380 ng/mL/hr <0.167 (L)  ALDOS/RENIN RATIO 0.0 - 30.0  >189.8 (H)   ASSESSMENT/PLAN/RECOMMENDATIONS:   Hyperaldosteronism :   -His screening test came back positive for elevated aldosterone and suppressed renin, I discussed with the patient the importance of confirming this, we discussed options to proceed with saline loading versus  oral salt loading.  Given the fact that he does not have any insurance and he is a self-pay we have opted to proceed with oral salt loading  -Patient will take 2 g of sodium chloride 3 times daily for 3 days, at the beginning of day 3 he will start 24-hour urine collection for sodium and aldosterone  -We will also proceed with adrenal imaging -After confirmation we will discussed AVS sampling -He was also given the option to proceed with medical therapy with spironolactone given lack of health insurance, but the patient opted with surgical option if appropriate  -I am going to increase his clonidine while he is on salt tablets, he was also instructed to check BP at home once daily and take an additional clonidine if needed with BG > 150/90 mmHg -Patient was also instructed to double up on potassium to 2 tablets for those 3 days that he is on salt tablets   Medications : Increase clonidine 0.3 mg to  TID x 3 days while on sodium chloride tabs Continue carvedilol 25 mg BID Continue hydralazine 100 mg TID Continue furosemide 20 mg daily Increase KCl 20 mEq TWO tabs daily x3 days while on sodium chloride tabs    Follow-up 3 months   Signed electronically by: Mack Guise, MD  [facility]  [facility] [address] [place], [place] [address] Phone: [phone] FAX: [phone]   CC: Jill Side, [place] [address] Phone: [phone] Fax: [phone]   Return to Endocrinology clinic as below: Future Appointments  Date Time Provider [place]  06/03/2021  7:50 AM Azelia Reiger, Melanie Crazier, MD [id number] None

## 2021-06-08 ENCOUNTER — Other Ambulatory Visit: Payer: Self-pay

## 2021-06-08 ENCOUNTER — Other Ambulatory Visit (INDEPENDENT_AMBULATORY_CARE_PROVIDER_SITE_OTHER): Payer: Self-pay

## 2021-06-08 DIAGNOSIS — E269 Hyperaldosteronism, unspecified: Secondary | ICD-10-CM

## 2021-06-08 LAB — POTASSIUM: Potassium: 4 mEq/L (ref 3.5–5.1)

## 2021-06-08 NOTE — Progress Notes (Unsigned)
Total volume 2500.  Started 06-07-2021 at 6:01 am ended 06-08-2021 at 6:01am.  ?

## 2021-06-17 LAB — SODIUM,24 HOUR URINE WITH CREATININE
Creatinine, 24H Ur: 1.5 g/(24.h) (ref 0.50–2.15)
Sodium, 24H Ur: 215 mmol/24 h (ref 52–380)
Sodium/Creat Ratio: 148 mmol/g creat (ref 30–180)

## 2021-06-17 LAB — ALDOSTERONE, URINE, 24 HOUR
Aldosterone, 24H Ur: 30 mcg/24 h
Creatinine, Urine mg/day-VMAUR: 1.5 g/(24.h) (ref 0.50–2.15)
Volume, Urine-ALDU: 2500 mL

## 2021-06-21 ENCOUNTER — Encounter: Payer: Self-pay | Admitting: Internal Medicine

## 2021-06-30 ENCOUNTER — Encounter: Payer: Self-pay | Admitting: Internal Medicine

## 2021-07-05 ENCOUNTER — Telehealth: Payer: Self-pay

## 2021-07-05 NOTE — Telephone Encounter (Signed)
I did not receive my discount as a self-pay patient for the lab bill. Per conversation with [facility], the doctor 's office can re-submit the information indicating I am self-pay and [facility] will reduce my charge. . Patient states that he did reach out to billing department but they were no help. Are you able to see if you can assist the patient .  ?

## 2021-07-12 NOTE — Telephone Encounter (Signed)
Called and advised [facility] that patient is uninsured.  They said since we do not have a phlebotomist they cannot adjust the rate.  Left message for Jake Levine to call back to discuss this issue. ?

## 2021-09-06 ENCOUNTER — Ambulatory Visit: Payer: Self-pay | Admitting: Internal Medicine

## 2021-09-23 ENCOUNTER — Encounter: Payer: Self-pay | Admitting: Internal Medicine

## 2022-01-02 ENCOUNTER — Other Ambulatory Visit: Payer: Self-pay | Admitting: Cardiovascular Disease

## 2022-01-06 ENCOUNTER — Encounter: Payer: Self-pay | Admitting: Cardiovascular Disease

## 2022-01-06 NOTE — Telephone Encounter (Signed)
Please contact pt for future appointment. Pt needing refills. 

## 2022-01-08 ENCOUNTER — Other Ambulatory Visit: Payer: Self-pay | Admitting: Cardiovascular Disease

## 2022-01-26 ENCOUNTER — Other Ambulatory Visit: Payer: Self-pay | Admitting: Cardiovascular Disease

## 2022-01-26 NOTE — Telephone Encounter (Signed)
Rx refill sent to pharmacy. 

## 2022-02-12 ENCOUNTER — Other Ambulatory Visit: Payer: Self-pay | Admitting: Cardiovascular Disease

## 2022-02-20 ENCOUNTER — Other Ambulatory Visit: Payer: Self-pay | Admitting: Cardiovascular Disease

## 2022-02-27 ENCOUNTER — Other Ambulatory Visit: Payer: Self-pay | Admitting: Cardiovascular Disease

## 2022-03-05 ENCOUNTER — Other Ambulatory Visit: Payer: Self-pay | Admitting: Cardiovascular Disease

## 2022-03-06 ENCOUNTER — Other Ambulatory Visit: Payer: Self-pay | Admitting: Cardiovascular Disease

## 2022-03-22 ENCOUNTER — Other Ambulatory Visit: Payer: Self-pay | Admitting: Cardiovascular Disease

## 2022-03-30 NOTE — Progress Notes (Signed)
Date:  04/04/2022   ID:  Jake Levine, DOB Mar 07, 1961, MRN [id number]  Patient Location:  [address] [place] [address]   Provider location:   [facility] [place], [place] office  PCP:  Lurline Del, MD  Cardiologist:  Fonnie Mu  Chief Complaint  Patient presents with   12 month follow up     "Doing well." Medications reviewed by the patient verbally.    History of Present Illness:    Jake Levine is a 61 y.o. male past medical history of HTN (untreated for years), nonischemic cardiomyopathy,  Ejection fraction down to 10-15% in 2016 obstructive sleep apnea, previously on CPAP but reportedly improved with 70 pound weight loss  Ejection fraction 50 to 55% in August 2018 who presents for follow up of his chronic systolic CHF and hypertensive heart disease.   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 There was moderate   concentric hypertrophy. Systolic function was normal. The   estimated ejection fraction was in the range of 50% to 55%. Wall   motion was normal; there were no regional wall motion   abnormalities. Doppler parameters are consistent with abnormal   left ventricular relaxation (grade 1 diastolic  dysfunction). - Left atrium: The atrium was mildly dilated. - Right ventricle: Systolic function was normal. - Pulmonary arteries: Systolic pressure was within the normal   range.    Long history of poorly controlled hypertension  admitted to [facility] 1/15-1/19/16 with acute systolic CF/dilated cardiomyopathy in the setting of severe hypertensive heart disease. BP was 172/115 upon presentation.  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  Echo on 04/18/14 showed an EF of 10-15%, Wall thickness was increased in a pattern of mild LVH. There was moderate concentric hypertrophy.  Unable to exclude RMA. GR3DD. Moderately dilated LA.   He underwent cardiac cath on 1/18 that showed widely patent coronary arteries, elevated LVEDP, known severe dilated cardiomyopathy. It was suspected the patient had severe hypertensive heart disease and medical management was recommended.     Past Medical History:  Diagnosis Date   Ascending aorta dilatation (HCC)    a. CTA 09/30/2014: prominent ascending ascending aorta measuring 3.9 cm, recommended annual imaging follow up by CTA or MRA   Cardiomegaly    Chronic systolic CHF (congestive heart failure) (HCC)    a. echo 04/2014: EF 10-15%, mild LVH, mod concentrtic hypertrophy,  RWMA cannot be excluded, GR3DD, left atrium mod dilated at 53 mm   CKD (chronic kidney disease), stage III (HCC)    a. baseline SCr 1.3   H/O medication noncompliance    Hilar lymphadenopathy    a. suggestive of sarcoidosis   HLD (hyperlipidemia)    Hypertension  Hypertensive heart disease    NICM (nonischemic cardiomyopathy) ([place])    a. cath 04/2014: widely patent coronary arteries, elevted LVEDP, known severe dilated CM   Sleep apnea    "lost 75#; no need for mask anymore" (04/18/14)   Past Surgical History:  Procedure Laterality Date   FACIAL FRACTURE SURGERY Right ~ [place] N/A 04/20/2014   Procedure: LEFT HEART CATHETERIZATION WITH CORONARY ANGIOGRAM;  Surgeon: Blane Ohara, MD;  Location: [facility] CATH LAB;  Service: Cardiovascular;  Laterality: N/A;   TUMOR EXCISION Left <2010   "fatty growth"   VASECTOMY      Allergies:   Patient has no known allergies.   Social History   Tobacco Use   Smoking status: Never   Smokeless tobacco: Never  Vaping Use   Vaping Use: Never used  Substance Use Topics   Alcohol use: Yes    Alcohol/week: 2.0 standard drinks of alcohol    Types: 2 Glasses of wine per week   Drug use: No     Current Outpatient Medications on File Prior to Visit  Medication Sig Dispense Refill   aspirin EC 81 MG tablet Take 81 mg by mouth daily.     carvedilol (COREG) 25 MG tablet TAKE 1 TABLET BY MOUTH TWICE DAILY WITH A MEAL 60 tablet 1   cloNIDine (CATAPRES) 0.3 MG tablet Take 1 tablet (0.3 mg total) by mouth 3 (three) times daily. 270 tablet 3   Coenzyme Q10 (CO Q-10) 100 MG CAPS Take 100 mg by mouth daily.     furosemide (LASIX) 20 MG tablet Take 1 tablet (20 mg total) by mouth daily. PLEASE CALL [phone] FOR AN APPOINTMENT FOR FURTHER REFILLS 30 tablet 0   hydrALAZINE (APRESOLINE) 100 MG tablet TAKE 1 TABLET BY MOUTH THREE TIMES DAILY 90 tablet 0   isosorbide mononitrate (IMDUR) 30 MG 24 hr tablet Take 1 tablet (30 mg total) by mouth at bedtime. Take with 60 mg pill for total of 90 mg at bedtime. 90 tablet 3   isosorbide mononitrate (IMDUR) 60 MG 24 hr tablet TAKE 1 TABLET BY MOUTH TWICE DAILY . APPOINTMENT REQUIRED FOR FUTURE REFILLS 30 tablet 0   losartan (COZAAR) 100 MG tablet Take 1 tablet by mouth once daily 60 tablet 0   potassium chloride SA (KLOR-CON) 20 MEQ tablet Take 1 tablet (20 mEq total) by mouth daily. 90 tablet 3   simvastatin (ZOCOR) 20 MG tablet TAKE 1 TABLET BY MOUTH ONCE DAILY AT  6PM 90 tablet 3   sodium chloride 1 g tablet Take 2 tablets (2 g total) by mouth 3 (three) times  daily. (Patient not taking: Reported on 04/04/2022) 20 tablet 0   No current facility-administered medications on file prior to visit.     Family Hx: The patient's family history includes Alcohol abuse in his maternal grandfather, maternal uncle, and paternal grandmother; Cancer in his father, maternal grandfather, and maternal uncle; Colon cancer in his father; Diabetes in his father; Lupus in his sister; Stroke in his mother.  ROS:   Please see the history of present illness.    Review of Systems  Constitutional: Negative.   HENT: Negative.    Respiratory: Negative.    Cardiovascular: Negative.   Gastrointestinal: Negative.   Musculoskeletal: Negative.   Neurological: Negative.   Psychiatric/Behavioral: Negative.    All other systems reviewed and are negative.    Labs/Other Tests and Data Reviewed:    Recent Labs: 06/03/2021: BUN  22; Creatinine, Ser 1.20; Sodium 141 06/08/2021: Potassium 4.0   Recent Lipid Panel Lab Results  Component Value Date/Time   CHOL 158 01/21/2021 08:55 AM   TRIG 40 01/21/2021 08:55 AM   HDL 70 01/21/2021 08:55 AM   CHOLHDL 2.3 01/21/2021 08:55 AM   CHOLHDL 3.3 04/20/2014 01:28 AM   LDLCALC 79 01/21/2021 08:55 AM    Wt Readings from Last 3 Encounters:  04/04/22 162 lb 4 oz (73.6 kg)  06/03/21 155 lb 12.8 oz (70.7 kg)  01/21/21 165 lb 8 oz (75.1 kg)     Exam:    Vital Signs: Vital signs may also be detailed in the HPI BP (!) 158/90 (BP Location: Left Arm, Patient Position: Sitting, Cuff Size: Normal)   Ht 5\' 4"  (1.626 m)   Wt 162 lb 4 oz (73.6 kg)   SpO2 96%   BMI 27.85 kg/m   Constitutional:  oriented to person, place, and time. No distress.  HENT:  Head: Grossly normal Eyes:  no discharge. No scleral icterus.  Neck: No JVD, no carotid bruits  Cardiovascular: Regular rate and rhythm, no murmurs appreciated Pulmonary/Chest: Clear to auscultation bilaterally, no wheezes or rails Abdominal: Soft.  no distension.  no tenderness.   Musculoskeletal: Normal range of motion Neurological:  normal muscle tone. Coordination normal. No atrophy Skin: Skin warm and dry Psychiatric: normal affect, pleasant  ASSESSMENT & PLAN:    Problem List Items Addressed This Visit       Cardiology Problems   Hypertensive heart disease   HLD (hyperlipidemia)   Chronic systolic CHF (congestive heart failure) (HCC) - Primary   Ascending aorta dilatation (HCC)   Cardiomyopathy- EF 10-1%- etiology not yet determined   Accelerated hypertension     Other   CKD (chronic kidney disease), stage III (HCC)  Accelerated hypertension - Blood pressure running high even on my recheck, he will call us with numbers from home.  May need to increase isosorbide or add additional agent if blood pressure runs high  Dilated cardiomyopathy ([place]) - Plan: EKG 12-Lead Ejection fraction improved in 2018, Improved ejection fraction with blood pressure control consistent with hypertensive heart disease history, has been stable past several years  Pure hypercholesterolemia Continue simvastatin,  Lipids followed by primary care   Chronic systolic CHF (congestive heart failure) ([place]) -  Echocardiogram January 2016 ejection fraction was 10-15% echocardiogram August 2018 ejection fraction 50 to 55% Stressed importance of continued blood pressure control Numbers running high today, he will check his numbers at home and call us for further medication adjustment   OSA (obstructive sleep apnea) -  Not on CPAP, feels that he does not need CPAP after prior weight loss  CKD (chronic kidney disease), stage III Done through primary care He is indicated he will call for appointment in the next month to have lab work, we can then request these results for our records   Total encounter time more than 30 minutes Greater than 50% was spent in counseling and coordination of care with the patient    Signed, Ida Rogue, [place] [facility] [id number], [place], [place] [id number]

## 2022-04-03 ENCOUNTER — Other Ambulatory Visit: Payer: Self-pay | Admitting: Cardiovascular Disease

## 2022-04-04 ENCOUNTER — Encounter: Payer: Self-pay | Admitting: Cardiovascular Disease

## 2022-04-04 ENCOUNTER — Ambulatory Visit: Payer: Self-pay | Attending: Cardiovascular Disease | Admitting: Cardiovascular Disease

## 2022-04-04 VITALS — BP 142/90 | HR 60 | Ht 64.0 in | Wt 162.2 lb

## 2022-04-04 DIAGNOSIS — N1831 Chronic kidney disease, stage 3a: Secondary | ICD-10-CM

## 2022-04-04 DIAGNOSIS — I5022 Chronic systolic (congestive) heart failure: Secondary | ICD-10-CM

## 2022-04-04 DIAGNOSIS — I10 Essential (primary) hypertension: Secondary | ICD-10-CM

## 2022-04-04 DIAGNOSIS — I420 Dilated cardiomyopathy: Secondary | ICD-10-CM

## 2022-04-04 DIAGNOSIS — E782 Mixed hyperlipidemia: Secondary | ICD-10-CM

## 2022-04-04 DIAGNOSIS — I110 Hypertensive heart disease with heart failure: Secondary | ICD-10-CM

## 2022-04-04 DIAGNOSIS — I77810 Thoracic aortic ectasia: Secondary | ICD-10-CM

## 2022-04-04 MED ORDER — FUROSEMIDE 20 MG PO TABS: 20.0000 mg | ORAL_TABLET | Freq: Every day | ORAL | 3 refills | Status: DC

## 2022-04-04 MED ORDER — ISOSORBIDE MONONITRATE ER 60 MG PO TB24: ORAL_TABLET | ORAL | 3 refills | Status: DC

## 2022-04-04 MED ORDER — ISOSORBIDE MONONITRATE ER 30 MG PO TB24: 30.0000 mg | ORAL_TABLET | Freq: Every day | ORAL | 3 refills | Status: DC

## 2022-04-04 MED ORDER — POTASSIUM CHLORIDE CRYS ER 20 MEQ PO TBCR: 20.0000 meq | EXTENDED_RELEASE_TABLET | Freq: Every day | ORAL | 3 refills | Status: DC

## 2022-04-04 MED ORDER — CLONIDINE HCL 0.3 MG PO TABS: 0.3000 mg | ORAL_TABLET | Freq: Two times a day (BID) | ORAL | 3 refills | Status: DC

## 2022-04-04 MED ORDER — SIMVASTATIN 20 MG PO TABS: ORAL_TABLET | ORAL | 3 refills | Status: DC

## 2022-04-04 NOTE — Telephone Encounter (Signed)
Patient scheduled to see Dr. Rockey Situ today. Will you please refill if appropriate? Thank you!

## 2022-04-04 NOTE — Patient Instructions (Signed)
Medication Instructions:   Your physician recommends that you continue on your current medications as directed. Please refer to the Current Medication list given to you today.  *If you need a refill on your cardiac medications before your next appointment, please call your pharmacy*   Lab Work:  None ordered - have PCP fax [place] lab work once completed [phone])  If you have labs (blood work) drawn today and your tests are completely normal, you will receive your results only by: [place] (if you have MyChart) OR A paper copy in the mail If you have any lab test that is abnormal or we need to change your treatment, we will call you to review the results.   Testing/Procedures:  None Ordered   Follow-Up: At [facility], you and your health needs are our priority.  As part of our continuing mission to provide you with exceptional heart care, we have created designated Provider Care Teams.  These Care Teams include your primary Cardiologist (physician) and Advanced Practice Providers (APPs -  Physician Assistants and Nurse Practitioners) who all work together to provide you with the care you need, when you need it.  We recommend signing up for the patient portal called "MyChart".  Sign up information is provided on this After Visit Summary.  MyChart is used to connect with patients for Virtual Visits (Telemedicine).  Patients are able to view lab/test results, encounter notes, upcoming appointments, etc.  Non-urgent messages can be sent to your provider as well.   To learn more about what you can do with MyChart, go to [email or web address].    Your next appointment:   12 month(s)  The format for your next appointment:   In Person  Provider:   You may see Ida Rogue, MD or one of the following Advanced Practice Providers on your designated Care Team:   Murray Hodgkins, NP Christell Faith, PA-C Cadence Kathlen Mody, PA-C Gerrie Nordmann, NP

## 2022-04-11 ENCOUNTER — Other Ambulatory Visit: Payer: Self-pay | Admitting: Cardiovascular Disease

## 2022-04-20 ENCOUNTER — Other Ambulatory Visit: Payer: Self-pay | Admitting: Cardiovascular Disease

## 2022-05-11 ENCOUNTER — Ambulatory Visit: Payer: Self-pay | Admitting: Family

## 2022-06-05 ENCOUNTER — Encounter: Payer: Self-pay | Admitting: Cardiovascular Disease

## 2022-07-11 ENCOUNTER — Encounter: Payer: Self-pay | Admitting: Cardiovascular Disease

## 2022-08-01 ENCOUNTER — Other Ambulatory Visit: Payer: Self-pay | Admitting: Cardiovascular Disease

## 2023-02-16 ENCOUNTER — Telehealth: Payer: Self-pay | Admitting: Cardiovascular Disease

## 2023-02-16 MED ORDER — HYDRALAZINE HCL 100 MG PO TABS: 100.0000 mg | ORAL_TABLET | Freq: Three times a day (TID) | ORAL | 2 refills | Status: DC

## 2023-02-16 NOTE — Telephone Encounter (Signed)
Last office visit 04/04/22 with plan to f/u in 12 months  next office visit:  04/24/23

## 2023-02-16 NOTE — Telephone Encounter (Signed)
*  STAT* If patient is at the pharmacy, call can be transferred to refill team.   1. Which medications need to be refilled? (please list name of each medication and dose if known)   Take 1 tablet (20 mg total) by mouth daily. PLEASE CALL [phone] FOR AN APPOINTMENT FOR FURTHER REFILLS    hydrALAZINE (APRESOLINE) 100 MG tablet    2. Which pharmacy/location (including street city if local pharmacy) is medication to be sent to? [facility] [address] [place], [place] - [address] Phone: [phone]  Fax: [phone]      3. Do they need a 30 day or 90 day supply? 90

## 2023-02-16 NOTE — Telephone Encounter (Signed)
Requested Prescriptions   Signed Prescriptions Disp Refills   hydrALAZINE (APRESOLINE) 100 MG tablet 90 tablet 2    Sig: Take 1 tablet (100 mg total) by mouth 3 (three) times daily.    Authorizing Provider: Antonieta Iba    Ordering User: Guerry Minors

## 2023-03-06 ENCOUNTER — Other Ambulatory Visit: Payer: Self-pay | Admitting: Cardiovascular Disease

## 2023-04-10 ENCOUNTER — Other Ambulatory Visit: Payer: Self-pay | Admitting: Cardiovascular Disease

## 2023-04-19 ENCOUNTER — Other Ambulatory Visit: Payer: Self-pay | Admitting: Cardiovascular Disease

## 2023-04-20 ENCOUNTER — Encounter: Payer: Self-pay | Admitting: Cardiovascular Disease

## 2023-04-20 ENCOUNTER — Other Ambulatory Visit: Payer: Self-pay | Admitting: Cardiovascular Disease

## 2023-04-20 NOTE — Telephone Encounter (Signed)
 error

## 2023-04-22 ENCOUNTER — Encounter: Payer: Self-pay | Admitting: Cardiovascular Disease

## 2023-04-23 NOTE — Progress Notes (Unsigned)
Date:  04/24/2023   ID:  Jake Levine, DOB Feb 01, 1961, MRN [id number]  Patient Location:  [address] [name] [address]   Provider location:   [facility], [place] office  PCP:  Bary Leriche, MD  Cardiologist:  [facility]  Chief Complaint  Patient presents with   12 month follow up     Patient c/o cough, congestion, body aches, last Saturday, fever of 100 and soreness in chest from coughing.     History of Present Illness:    Jake Levine is a 63 y.o. male past medical history of HTN (untreated for years), nonischemic cardiomyopathy,  Ejection fraction down to 10-15% in 2016 obstructive sleep apnea, previously on CPAP but reportedly improved with 70 pound weight loss  Ejection fraction 50 to 55% in August 2018 who presents for follow up of his chronic systolic CHF and hypertensive heart disease.   Last seen by myself in clinic  1/24 Reports viral type symptoms over the weekend with fever, body ache, cough, congestion Soreness in chest from coughing  Follows with Dr. 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, active,  Weight stable, not on CPAP  EKG personally reviewed by myself on todays visit EKG Interpretation Date/Time:  Tuesday April 24 2023 08:59:11 EST Ventricular Rate:  60 PR Interval:  236 QRS Duration:  112 QT Interval:  404 QTC Calculation: 404 R Axis:   -30  Text Interpretation: Sinus rhythm with 1st degree A-V block Left axis deviation ST & T wave abnormality, consider lateral ischemia When compared with ECG of 02-Feb-2009 08:47, QRS axis Shifted left Inverted T waves have replaced nonspecific T wave abnormality in Lateral leads  Confirmed by Julien Nordmann [phone]) on 04/24/2023 9:00:40 AM    Other past medical history reviewed echo 11/2016  - Left ventricle: The cavity size was normal. There was moderate   concentric hypertrophy. Systolic function was normal. The   estimated ejection fraction was in the range of 50% to 55%. Wall   motion was normal; there were no regional wall motion   abnormalities. Doppler parameters are consistent with abnormal   left ventricular relaxation (grade 1 diastolic dysfunction). - Left atrium: The atrium was mildly dilated. - Right ventricle: Systolic function was normal. - Pulmonary arteries: Systolic pressure was within the normal   range.    Long history of poorly controlled hypertension  admitted to [facility] 1/15-1/19/16 with acute systolic CF/dilated cardiomyopathy in the setting of severe hypertensive heart disease. BP was 172/115 upon presentation.  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  Echo on 04/18/14 showed an EF of 10-15%, Wall thickness was increased in a pattern of mild LVH. There was moderate concentric hypertrophy.  Unable to exclude RMA. GR3DD. Moderately dilated LA.   He underwent cardiac cath on 1/18 that showed widely patent coronary arteries, elevated LVEDP, known severe dilated cardiomyopathy. It was suspected the patient had severe hypertensive heart disease and medical management was recommended.     Past Medical History:  Diagnosis Date   Ascending aorta dilatation (HCC)    a. CTA 09/30/2014: prominent ascending ascending aorta measuring 3.9 cm, recommended annual imaging follow up by CTA or MRA  Cardiomegaly    Chronic systolic CHF (congestive heart failure) (HCC)    a. echo 04/2014: EF 10-15%, mild LVH, mod concentrtic hypertrophy,  RWMA cannot be excluded, GR3DD, left atrium mod dilated at 53 mm   CKD (chronic kidney disease), stage III (HCC)    a. baseline  SCr 1.3   H/O medication noncompliance    Hilar lymphadenopathy    a. suggestive of sarcoidosis   HLD (hyperlipidemia)    Hypertension    Hypertensive heart disease    NICM (nonischemic cardiomyopathy) (HCC)    a. cath 04/2014: widely patent coronary arteries, elevted LVEDP, known severe dilated CM   Sleep apnea    "lost 75#; no need for mask anymore" (04/18/14)   Past Surgical History:  Procedure Laterality Date   FACIAL FRACTURE SURGERY Right ~ 1990   LEFT HEART CATHETERIZATION WITH CORONARY ANGIOGRAM N/A 04/20/2014   Procedure: LEFT HEART CATHETERIZATION WITH CORONARY ANGIOGRAM;  Surgeon: Micheline Chapman, MD;  Location: [facility] CATH LAB;  Service: Cardiovascular;  Laterality: N/A;   TUMOR EXCISION Left <2010   "fatty growth"   VASECTOMY      Allergies:   Patient has no known allergies.   Social History   Tobacco Use   Smoking status: Never   Smokeless tobacco: Never  Vaping Use   Vaping status: Never Used  Substance Use Topics   Alcohol use: Yes    Alcohol/week: 2.0 standard drinks of alcohol    Types: 2 Glasses of wine per week   Drug use: No     Current Outpatient Medications on File Prior to Visit  Medication Sig Dispense Refill   carvedilol (COREG) 25 MG tablet TAKE 1 TABLET BY MOUTH TWICE DAILY WITH A MEAL 60 tablet 0   Cholecalciferol (VITAMIN D-3) 125 MCG (5000 UT) TABS Take by mouth in the morning and at bedtime.     cloNIDine (CATAPRES) 0.3 MG tablet Take 1 tablet by mouth twice daily 180 tablet 3   Cyanocobalamin (VITAMIN B 12 PO) Take 100 mg by mouth daily.     ferrous sulfate 325 (65 FE) MG tablet Take 325 mg by mouth daily with breakfast.     furosemide (LASIX) 20 MG tablet Take 1 tablet (20 mg total) by mouth daily. PLEASE CALL [phone] FOR AN APPOINTMENT FOR FURTHER REFILLS 90 tablet 3   hydrALAZINE (APRESOLINE) 100 MG tablet Take 1 tablet (100 mg total) by mouth 3 (three) times daily. 90 tablet 2   isosorbide mononitrate (IMDUR) 30 MG 24 hr tablet Take 1  tablet (30 mg total) by mouth at bedtime. Take with 60 mg pill for total of 90 mg at bedtime. 90 tablet 3   isosorbide mononitrate (IMDUR) 60 MG 24 hr tablet Take 1 tablet by mouth twice daily 60 tablet 0   losartan (COZAAR) 100 MG tablet Take 1 tablet by mouth once daily 90 tablet 0   No current facility-administered medications on file prior to visit.     Family Hx: The patient's family history includes Alcohol abuse in his maternal grandfather, maternal uncle, and paternal grandmother; Cancer in his father, maternal grandfather, and maternal uncle; Colon cancer in his father; Diabetes in his father; Lupus in his sister; Stroke in his mother.  ROS:   Please see the history of present illness.    Review of Systems  Constitutional: Negative.   HENT: Negative.    Respiratory: Negative.    Cardiovascular: Negative.   Gastrointestinal: Negative.   Musculoskeletal: Negative.   Neurological: Negative.  Psychiatric/Behavioral: Negative.    All other systems reviewed and are negative.    Labs/Other Tests and Data Reviewed:    Recent Labs: No results found for requested labs within last 365 days.   Recent Lipid Panel Lab Results  Component Value Date/Time   CHOL 158 01/21/2021 08:55 AM   TRIG 40 01/21/2021 08:55 AM   HDL 70 01/21/2021 08:55 AM   CHOLHDL 2.3 01/21/2021 08:55 AM   CHOLHDL 3.3 04/20/2014 01:28 AM   LDLCALC 79 01/21/2021 08:55 AM    Wt Readings from Last 3 Encounters:  04/24/23 170 lb 2 oz (77.2 kg)  04/04/22 162 lb 4 oz (73.6 kg)  06/03/21 155 lb 12.8 oz (70.7 kg)     Exam:    Vital Signs: Vital signs may also be detailed in the HPI BP (!) 140/80 (BP Location: Left Arm, Patient Position: Sitting, Cuff Size: Normal)   Pulse 60   Temp 97.6 F (36.4 C)   Ht 5\' 6"  (1.676 m)   Wt 170 lb 2 oz (77.2 kg)   SpO2 98%   BMI 27.46 kg/m   Constitutional:  oriented to person, place, and time. No distress.  HENT:  Head: Grossly normal Eyes:  no discharge. No  scleral icterus.  Neck: No JVD, no carotid bruits  Cardiovascular: Regular rate and rhythm, no murmurs appreciated Pulmonary/Chest: Clear to auscultation bilaterally, no wheezes or rails Abdominal: Soft.  no distension.  no tenderness.  Musculoskeletal: Normal range of motion Neurological:  normal muscle tone. Coordination normal. No atrophy Skin: Skin warm and dry Psychiatric: normal affect, pleasant   ASSESSMENT & PLAN:    Problem List Items Addressed This Visit       Cardiology Problems   Hypertensive heart disease   Relevant Orders   EKG 12-Lead (Completed)   HLD (hyperlipidemia)   Chronic systolic CHF (congestive heart failure) (HCC) - Primary   Relevant Orders   EKG 12-Lead (Completed)   Ascending aorta dilatation (HCC)   Cardiomyopathy- EF 10-1%- etiology not yet determined   Relevant Orders   EKG 12-Lead (Completed)   Accelerated hypertension   Relevant Orders   EKG 12-Lead (Completed)     Other   CKD (chronic kidney disease), stage III (HCC)   Accelerated hypertension - Blood pressure is well controlled on today's visit. No changes made to the medications.  Dilated cardiomyopathy (HCC) - Plan: EKG 12-Lead Ejection fraction improved in 2018, 50 to 55% Stressed importance of continued aggressive blood pressure management  Pure hypercholesterolemia Simvastatin stopped by PMD, LDL 74 on the medication   Chronic systolic CHF (congestive heart failure) (HCC) -  Echocardiogram January 2016 ejection fraction was 10-15% echocardiogram August 2018 ejection fraction 50 to 55% Appears euvolemic, off Lasix and potassium   OSA (obstructive sleep apnea) -  Not on CPAP, feels his sleep hygiene is stable  CKD (chronic kidney disease), stage III Normal creatinine on recent lab work Off Lasix potassium  Viral syndrome Coughing, sinus, aching, Viral syndrome running its course   Signed, Julien Nordmann, MD  [facility] [address], [place], [place] [address]

## 2023-04-24 ENCOUNTER — Encounter: Payer: Self-pay | Admitting: Cardiovascular Disease

## 2023-04-24 ENCOUNTER — Ambulatory Visit: Payer: Self-pay | Attending: Cardiovascular Disease | Admitting: Cardiovascular Disease

## 2023-04-24 VITALS — BP 140/80 | HR 60 | Temp 97.6°F | Ht 66.0 in | Wt 170.1 lb

## 2023-04-24 DIAGNOSIS — I5022 Chronic systolic (congestive) heart failure: Secondary | ICD-10-CM

## 2023-04-24 DIAGNOSIS — I10 Essential (primary) hypertension: Secondary | ICD-10-CM

## 2023-04-24 DIAGNOSIS — E782 Mixed hyperlipidemia: Secondary | ICD-10-CM

## 2023-04-24 DIAGNOSIS — N1831 Chronic kidney disease, stage 3a: Secondary | ICD-10-CM

## 2023-04-24 DIAGNOSIS — I110 Hypertensive heart disease with heart failure: Secondary | ICD-10-CM

## 2023-04-24 DIAGNOSIS — I77810 Thoracic aortic ectasia: Secondary | ICD-10-CM

## 2023-04-24 DIAGNOSIS — I420 Dilated cardiomyopathy: Secondary | ICD-10-CM

## 2023-04-24 MED ORDER — FUROSEMIDE 20 MG PO TABS: 20.0000 mg | ORAL_TABLET | Freq: Every day | ORAL | 3 refills | Status: DC

## 2023-04-24 MED ORDER — CARVEDILOL 25 MG PO TABS: 25.0000 mg | ORAL_TABLET | Freq: Two times a day (BID) | ORAL | 3 refills | Status: DC

## 2023-04-24 MED ORDER — ISOSORBIDE MONONITRATE ER 30 MG PO TB24: 30.0000 mg | ORAL_TABLET | Freq: Every day | ORAL | 3 refills | Status: AC

## 2023-04-24 MED ORDER — ISOSORBIDE MONONITRATE ER 60 MG PO TB24: 60.0000 mg | ORAL_TABLET | Freq: Two times a day (BID) | ORAL | 3 refills | Status: DC

## 2023-04-24 MED ORDER — HYDRALAZINE HCL 100 MG PO TABS: 100.0000 mg | ORAL_TABLET | Freq: Three times a day (TID) | ORAL | 2 refills | Status: AC

## 2023-04-24 NOTE — Patient Instructions (Signed)

## 2023-04-25 ENCOUNTER — Encounter: Payer: Self-pay | Admitting: Cardiovascular Disease

## 2023-07-14 ENCOUNTER — Other Ambulatory Visit: Payer: Self-pay | Admitting: Cardiovascular Disease

## 2023-07-16 MED ORDER — LOSARTAN POTASSIUM 100 MG PO TABS: 100.0000 mg | ORAL_TABLET | Freq: Every day | ORAL | 3 refills | Status: DC

## 2023-07-26 ENCOUNTER — Encounter: Payer: Self-pay | Admitting: Cardiovascular Disease

## 2023-08-28 ENCOUNTER — Other Ambulatory Visit: Payer: Self-pay | Admitting: Cardiovascular Disease

## 2023-12-19 ENCOUNTER — Other Ambulatory Visit: Payer: Self-pay | Admitting: Cardiovascular Disease

## 2024-04-14 ENCOUNTER — Encounter: Payer: Self-pay | Admitting: Cardiovascular Disease

## 2024-04-17 NOTE — Progress Notes (Addendum)
 "  Cardiology Office Note    Date:  04/18/2024   ID:  Jake Levine, DOB February 24, 1961, MRN [id number]  PCP:  Trudy Dorn Lunger, MD (Inactive)  Cardiologist:  Evalene Lunger, MD  Electrophysiologist:  None   Chief Complaint: Follow up  History of Present Illness:   Jake Levine is a 64 y.o. male with history of heart failure with improved EF, dilated cardiomyopathy, hypertensive heart disease, hypertension, OSA, and hyperlipidemia who presents for follow up on CHF.    Patient was admitted to [facility] 04/2014 with acute systolic heart failure/dilated cardiomyopathy in the setting of severe hypertensive heart disease.  BP was 172/115 on presentation.  He had symptoms of cough, dyspnea on exertion, orthopnea, and postnasal drip.  He had been off antihypertensive medications for many years.  Echo revealed an EF of 10 to 15% with LVH and G3 DD.  He underwent cardiac cath that showed widely patent coronary arteries, elevated LVEDP, and known severe dilated cardiomyopathy.  It was suspected that patient had severe hypertensive heart disease and medical management was recommended.  Patient has since followed with Dr. Gollan for his cardiomyopathy.  Repeat echo 11/2016 revealed EF improved to normal at 50 to 55% with moderate concentric LVH and G1 DD.   Patient was most recently seen in the cardiology clinic 04/24/2023.  His primary care had reportedly discontinued several of his medications including simvastatin , Lasix , and potassium.  Blood pressure was well-controlled and no further testing or medication changes were made at that time.  Patient presents today overall feeling well from a cardiac perspective.  At the end of December, he reports that his blood pressure was very elevated in the 190s to 200s systolic at home and he became dizzy at work, prompting him to schedule an appointment with his PCP.  He reports that he has been following closely with his PCP regarding difficult to  control hypertension.  He reports continued readings at home in the 170s to 180s, although he questions the accuracy of his cuff after seeing the manual readings we obtained in office today.  He otherwise feels well without chest pain, palpitations, shortness of breath, lightheadedness, and lower extremity swelling.  Labs independently reviewed: 03/2024-Hgb 13.3, HCT 40.9, platelets 187, creatinine 1.45, BUN 19, normal LFTs, TC 221, TG 66, HDL 72, LDL 137  Objective   Past Medical History:  Diagnosis Date   Ascending aorta dilatation    a. CTA 09/30/2014: prominent ascending ascending aorta measuring 3.9 cm, recommended annual imaging follow up by CTA or MRA   Cardiomegaly    Chronic systolic CHF (congestive heart failure) (HCC)    a. echo 04/2014: EF 10-15%, mild LVH, mod concentrtic hypertrophy,  RWMA cannot be excluded, GR3DD, left atrium mod dilated at 53 mm   CKD (chronic kidney disease), stage III (HCC)    a. baseline SCr 1.3   H/O medication noncompliance    Hilar lymphadenopathy    a. suggestive of sarcoidosis   HLD (hyperlipidemia)    Hypertension    Hypertensive heart disease    NICM (nonischemic cardiomyopathy) (HCC)    a. cath 04/2014: widely patent coronary arteries, elevted LVEDP, known severe dilated CM   Sleep apnea    lost 75#; no need for mask anymore (04/18/14)    Current Medications: Active Medications[1]  Allergies:   Patient has no known allergies.   Social History   Socioeconomic History   Marital status: Married    Spouse name: Not on file   Number of  children: Not on file   Years of education: Not on file   Highest education level: Not on file  Occupational History   Not on file  Tobacco Use   Smoking status: Never   Smokeless tobacco: Never  Vaping Use   Vaping status: Never Used  Substance and Sexual Activity   Alcohol use: Yes    Alcohol/week: 2.0 standard drinks of alcohol    Types: 2 Glasses of wine per week   Drug use: No   Sexual  activity: Yes    Partners: Female    Birth control/protection: Surgical  Other Topics Concern   Not on file  Social History Narrative   Pt is married, employed in maintenance, has a high school diploma    Caffeine- Green tea, coffee    Social Drivers of Health   Tobacco Use: Low Risk (04/18/2024)   Patient History    Smoking Tobacco Use: Never    Smokeless Tobacco Use: Never    Passive Exposure: Not on file  Financial Resource Strain: Not on file  Food Insecurity: Not on file  Transportation Needs: Not on file  Physical Activity: Not on file  Stress: Not on file  Social Connections: Not on file  Depression (EYV7-0): Not on file  Alcohol Screen: Not on file  Housing: Not on file  Utilities: Not on file  Health Literacy: Not on file     Family History:  The patient's family history includes Alcohol abuse in his maternal grandfather, maternal uncle, and paternal grandmother; Cancer in his father, maternal grandfather, and maternal uncle; Colon cancer in his father; Diabetes in his father; Lupus in his sister; Stroke in his mother.  ROS:   12-point review of systems is negative unless otherwise noted in the HPI.  EKGs/Other Studies Reviewed:    Studies reviewed were summarized above. The additional studies were reviewed today:  11/2016 2D echo - Left ventricle: The cavity size was normal. There was moderate    concentric hypertrophy. Systolic function was normal. The    estimated ejection fraction was in the range of 50% to 55%. Wall    motion was normal; there were no regional wall motion    abnormalities. Doppler parameters are consistent with abnormal    left ventricular relaxation (grade 1 diastolic dysfunction).  - Left atrium: The atrium was mildly dilated.  - Right ventricle: Systolic function was normal.  - Pulmonary arteries: Systolic pressure was within the normal range.   EKG:  EKG personally reviewed by me today EKG Interpretation Date/Time:  Friday April 18 2024 10:26:51 EST Ventricular Rate:  59 PR Interval:  242 QRS Duration:  104 QT Interval:  416 QTC Calculation: 411 R Axis:   -35  Text Interpretation: Sinus bradycardia with 1st degree A-V block Left axis deviation T wave abnormality, consider lateral ischemia When compared with ECG of 24-Apr-2023 08:59, No significant change was found Confirmed by Lorene Sinclair ([id number]) on 04/18/2024 10:32:22 AM  PHYSICAL EXAM:    VS:  BP 120/84   Pulse (!) 59   Ht 5' 5 (1.651 m)   Wt 168 lb 9.6 oz (76.5 kg)   SpO2 97%   BMI 28.06 kg/m   BMI: Body mass index is 28.06 kg/m.  GEN: Well nourished, well developed in no acute distress NECK: No JVD; No carotid bruits CARDIAC: RRR, no murmurs, rubs, gallops RESPIRATORY:  Clear to auscultation without rales, wheezing or rhonchi  ABDOMEN: Soft, non-tender, non-distended EXTREMITIES: No edema; No deformity  Wt Readings  from Last 3 Encounters:  04/18/24 168 lb 9.6 oz (76.5 kg)  04/24/23 170 lb 2 oz (77.2 kg)  04/04/22 162 lb 4 oz (73.6 kg)                  ASSESSMENT & PLAN:   Chronic HFimpEF Dilated cardiomyopathy Essential hypertension - EF recovered to 50 to 55% on most recent echo in 2018, previously 10 to 15% in the setting of hypertensive emergency.  He appears euvolemic on exam and is not currently requiring a standing loop diuretic.  Patient reports elevated blood pressure readings at home in the 170s to 190s systolic.  BP is mildly elevated here with readings ranging from 120 to 140 systolic.  Patient appears to previously have been following with endocrinology for possible hyperaldosteronism.  With elevated aldosterone and low renin on prior lab work in 2022. However, he reports that he could not afford the co-pays or further testing they required through endocrinology.  Recommended trial of spironolactone  25 mg daily.  He will continue carvedilol  25 mg twice daily, clonidine  0.2 mg twice daily, hydralazine  100 mg 3 times daily, and Imdur   90 mg daily.  Update BMP in 2 weeks.  He prefers to continue following with his PCP for ongoing management of hypertension. Could consider referral back to endocrinology at the discretion of his PCP.   Hyperlipidemia - Most recent lipid panel 03/2024 with LDL 137, goal less than 100.  Patient was previously managed with statin therapy which was discontinued by PCP.  He prefers to focus on lifestyle modifications for management of hyperlipidemia.  OSA - Not on CPAP.    Disposition: Start spironolactone  25 mg daily. Update BMP in 2 weeks. F/u with Dr. Gollan or an APP in 1 year.   Medication Adjustments/Labs and Tests Ordered: Current medicines are reviewed at length with the patient today.  Concerns regarding medicines are outlined above. Medication changes, Labs and Tests ordered today are summarized above and listed in the Patient Instructions accessible in Encounters.   Bonney Lesley Maffucci, PA-C 04/18/2024 11:28 AM      [facility] [address] [place], [place] [address] [phone]      [1]  Current Meds  Medication Sig   carvedilol  (COREG ) 25 MG tablet TAKE 1 TABLET BY MOUTH TWICE DAILY WITH A MEAL   Cholecalciferol (VITAMIN D-3) 125 MCG (5000 UT) TABS Take by mouth in the morning and at bedtime.   cloNIDine  (CATAPRES ) 0.3 MG tablet Take 1 tablet by mouth twice daily (Patient taking differently: Take 0.2 mg by mouth 2 (two) times daily.)   Cyanocobalamin (VITAMIN B 12 PO) Take 100 mg by mouth daily.   ferrous sulfate 325 (65 FE) MG tablet Take 325 mg by mouth daily with breakfast.   hydrALAZINE  (APRESOLINE ) 100 MG tablet Take 1 tablet (100 mg total) by mouth 3 (three) times daily.   isosorbide  mononitrate (IMDUR ) 30 MG 24 hr tablet Take 1 tablet (30 mg total) by mouth at bedtime. Take with 60 mg pill for total of 90 mg at bedtime.   isosorbide  mononitrate (IMDUR ) 60 MG 24 hr tablet Take 1 tablet by mouth twice daily   spironolactone   (ALDACTONE ) 25 MG tablet Take 1 tablet (25 mg total) by mouth daily.   [DISCONTINUED] furosemide  (LASIX ) 20 MG tablet Take 1 tablet (20 mg total) by mouth daily.   [DISCONTINUED] losartan  (COZAAR ) 100 MG tablet Take 1 tablet (100 mg total) by mouth daily.   "

## 2024-04-18 ENCOUNTER — Ambulatory Visit: Payer: Self-pay | Attending: Physician Assistant | Admitting: Physician Assistant

## 2024-04-18 ENCOUNTER — Encounter: Payer: Self-pay | Admitting: Physician Assistant

## 2024-04-18 VITALS — BP 120/84 | HR 59 | Ht 65.0 in | Wt 168.6 lb

## 2024-04-18 DIAGNOSIS — G4733 Obstructive sleep apnea (adult) (pediatric): Secondary | ICD-10-CM

## 2024-04-18 DIAGNOSIS — I5022 Chronic systolic (congestive) heart failure: Secondary | ICD-10-CM

## 2024-04-18 DIAGNOSIS — I420 Dilated cardiomyopathy: Secondary | ICD-10-CM

## 2024-04-18 DIAGNOSIS — I10 Essential (primary) hypertension: Secondary | ICD-10-CM

## 2024-04-18 DIAGNOSIS — I5020 Unspecified systolic (congestive) heart failure: Secondary | ICD-10-CM

## 2024-04-18 DIAGNOSIS — I110 Hypertensive heart disease with heart failure: Secondary | ICD-10-CM

## 2024-04-18 DIAGNOSIS — E782 Mixed hyperlipidemia: Secondary | ICD-10-CM

## 2024-04-18 MED ORDER — SPIRONOLACTONE 25 MG PO TABS: 25.0000 mg | ORAL_TABLET | Freq: Every day | ORAL | 3 refills | Status: AC

## 2024-04-18 NOTE — Patient Instructions (Signed)
 Medication Instructions:  Your physician recommends the following medication changes.  START TAKING: Spironolactone  25 mg Daily   *If you need a refill on your cardiac medications before your next appointment, please call your pharmacy*  Lab Work: Your provider would like for you to return in 2 weeks to have the following labs drawn: BMP.   Please go to [facility] [address] ([facility] [address], [place] [address] You do not need an appointment.  They are open from 8 am- 4:30 pm.  Lunch from 1:00 pm- 2:00 pm You do not need to be fasting.   You may also go to one of the following [facility]:  [address] [place], [place] [address] Phone: [phone] Lab hours: Mon-Fri 8 am- 5 pm    Lunch 12 pm- 1 pm  [address] [place],  [place]  [address]  [place]  Phone: [phone] Lab hours: 7 am- 4 pm Lunch 12 pm-1 pm   [address] [place],  [place]  [address]  [place]  Phone: [phone] Lab hours: Mon-Fri 8 am- 5 pm    Lunch 12 pm- 1 pm  If you have labs (blood work) drawn today and your tests are completely normal, you will receive your results only by: MyChart Message (if you have MyChart) OR A paper copy in the mail If you have any lab test that is abnormal or we need to change your treatment, we will call you to review the results.  Testing/Procedures: No test ordered today   Follow-Up: At [facility], you and your health needs are our priority.  As part of our continuing mission to provide you with exceptional heart care, our providers are all part of one team.  This team includes your primary Cardiologist (physician) and Advanced Practice Providers or APPs (Physician Assistants and Nurse Practitioners) who all work together to provide you with the care you need, when you need it.  Your next appointment:   1 year(s)  Provider:   You may see Timothy Gollan, MD or one of the following Advanced Practice Providers on your designated Care Team:    Lonni Meager, NP Lesley Maffucci, PA-C Bernardino Bring, PA-C Cadence Manilla, PA-C Tylene Lunch, NP Barnie Hila, NP    We recommend signing up for the patient portal called MyChart.  Sign up information is provided on this After Visit Summary.  MyChart is used to connect with patients for Virtual Visits (Telemedicine).  Patients are able to view lab/test results, encounter notes, upcoming appointments, etc.  Non-urgent messages can be sent to your provider as well.   To learn more about what you can do with MyChart, go to [email or web address].   Other Instructions
